# Patient Record
Sex: Female | Born: 1980 | Race: White | Hispanic: No | Marital: Single | Smoking: Current every day smoker
Health system: Southern US, Community
[De-identification: ages and names within clinical notes are randomized; demographics above are authoritative.]

## PROBLEM LIST (undated history)

## (undated) DIAGNOSIS — O009 Unspecified ectopic pregnancy without intrauterine pregnancy: Secondary | ICD-10-CM

## (undated) DIAGNOSIS — M549 Dorsalgia, unspecified: Secondary | ICD-10-CM

## (undated) DIAGNOSIS — G4700 Insomnia, unspecified: Secondary | ICD-10-CM

## (undated) DIAGNOSIS — R51 Headache: Secondary | ICD-10-CM

## (undated) DIAGNOSIS — K5900 Constipation, unspecified: Secondary | ICD-10-CM

## (undated) DIAGNOSIS — R410 Disorientation, unspecified: Secondary | ICD-10-CM

## (undated) DIAGNOSIS — K649 Unspecified hemorrhoids: Secondary | ICD-10-CM

## (undated) DIAGNOSIS — M1990 Unspecified osteoarthritis, unspecified site: Secondary | ICD-10-CM

## (undated) DIAGNOSIS — Z8709 Personal history of other diseases of the respiratory system: Secondary | ICD-10-CM

## (undated) DIAGNOSIS — M5430 Sciatica, unspecified side: Secondary | ICD-10-CM

## (undated) DIAGNOSIS — F329 Major depressive disorder, single episode, unspecified: Secondary | ICD-10-CM

## (undated) DIAGNOSIS — F32A Depression, unspecified: Secondary | ICD-10-CM

## (undated) DIAGNOSIS — R531 Weakness: Secondary | ICD-10-CM

## (undated) DIAGNOSIS — I10 Essential (primary) hypertension: Secondary | ICD-10-CM

## (undated) HISTORY — PX: ECTOPIC PREGNANCY SURGERY: SHX613

## (undated) HISTORY — PX: CHOLECYSTECTOMY: SHX55

---

## 2005-01-07 ENCOUNTER — Ambulatory Visit (HOSPITAL_COMMUNITY): Admission: RE | Admit: 2005-01-07 | Discharge: 2005-01-07 | Payer: Self-pay

## 2011-01-16 ENCOUNTER — Encounter: Payer: Self-pay | Admitting: Family Medicine

## 2013-12-06 ENCOUNTER — Other Ambulatory Visit: Payer: Self-pay | Admitting: Neurosurgery

## 2013-12-10 ENCOUNTER — Emergency Department (HOSPITAL_COMMUNITY): Payer: Medicaid Other

## 2013-12-10 ENCOUNTER — Emergency Department (HOSPITAL_COMMUNITY)
Admission: EM | Admit: 2013-12-10 | Discharge: 2013-12-11 | Disposition: A | Discharge status: Home / Self Care | Payer: Medicaid Other | Attending: Emergency Medicine | Admitting: Emergency Medicine

## 2013-12-10 ENCOUNTER — Encounter (HOSPITAL_COMMUNITY): Payer: Self-pay | Admitting: Emergency Medicine

## 2013-12-10 DIAGNOSIS — I10 Essential (primary) hypertension: Secondary | ICD-10-CM | POA: Insufficient documentation

## 2013-12-10 DIAGNOSIS — F17200 Nicotine dependence, unspecified, uncomplicated: Secondary | ICD-10-CM | POA: Insufficient documentation

## 2013-12-10 DIAGNOSIS — K5909 Other constipation: Secondary | ICD-10-CM | POA: Insufficient documentation

## 2013-12-10 DIAGNOSIS — M549 Dorsalgia, unspecified: Secondary | ICD-10-CM | POA: Insufficient documentation

## 2013-12-10 DIAGNOSIS — Z3202 Encounter for pregnancy test, result negative: Secondary | ICD-10-CM | POA: Insufficient documentation

## 2013-12-10 DIAGNOSIS — Z79899 Other long term (current) drug therapy: Secondary | ICD-10-CM | POA: Insufficient documentation

## 2013-12-10 HISTORY — DX: Unspecified ectopic pregnancy without intrauterine pregnancy: O00.90

## 2013-12-10 HISTORY — DX: Dorsalgia, unspecified: M54.9

## 2013-12-10 HISTORY — DX: Essential (primary) hypertension: I10

## 2013-12-10 LAB — BASIC METABOLIC PANEL
Chloride: 92 mEq/L — ABNORMAL LOW (ref 96–112)
Creatinine, Ser: 1.21 mg/dL — ABNORMAL HIGH (ref 0.50–1.10)
GFR calc Af Amer: 67 mL/min — ABNORMAL LOW (ref >90)
Potassium: 3.7 mEq/L (ref 3.5–5.1)
Sodium: 134 mEq/L — ABNORMAL LOW (ref 135–145)

## 2013-12-10 LAB — CBC WITH DIFFERENTIAL/PLATELET
Eosinophils Relative: 2 % (ref 0–5)
Hemoglobin: 14.3 g/dL (ref 12.0–15.0)
Lymphocytes Relative: 18 % (ref 12–46)
Monocytes Relative: 6 % (ref 3–12)
Neutrophils Relative %: 74 % (ref 43–77)
Platelets: 355 10*3/uL (ref 150–400)
RBC: 4.42 MIL/uL (ref 3.87–5.11)
WBC: 18.4 10*3/uL — ABNORMAL HIGH (ref 4.0–10.5)

## 2013-12-10 LAB — POCT PREGNANCY, URINE: Preg Test, Ur: NEGATIVE

## 2013-12-10 MED ORDER — ONDANSETRON HCL 4 MG/2ML IJ SOLN
4.0000 mg | Freq: Once | INTRAMUSCULAR | Status: AC
  Administered 2013-12-10: 4 mg via INTRAVENOUS
  Filled 2013-12-10: qty 2

## 2013-12-10 MED ORDER — MORPHINE SULFATE 4 MG/ML IJ SOLN
4.0000 mg | Freq: Once | INTRAMUSCULAR | Status: AC
  Administered 2013-12-10: 4 mg via INTRAVENOUS
  Filled 2013-12-10: qty 1

## 2013-12-10 MED ORDER — IOHEXOL 300 MG/ML  SOLN
100.0000 mL | Freq: Once | INTRAMUSCULAR | Status: AC | PRN
  Administered 2013-12-10: 100 mL via INTRAVENOUS

## 2013-12-10 MED ORDER — IOHEXOL 300 MG/ML  SOLN
50.0000 mL | Freq: Once | INTRAMUSCULAR | Status: AC | PRN
  Administered 2013-12-10: 50 mL via ORAL

## 2013-12-10 MED ORDER — SODIUM CHLORIDE 0.9 % IV SOLN
Freq: Once | INTRAVENOUS | Status: AC
  Administered 2013-12-10: 22:00:00 via INTRAVENOUS

## 2013-12-10 NOTE — ED Notes (Signed)
C/o constipation, tried ex-lax and MOM yesterday without relief, taking pain meds for back pain, no BM for at least a week

## 2013-12-11 MED ORDER — GLYCERIN (LAXATIVE) 1.2 G RE SUPP
RECTAL | Status: AC
  Filled 2013-12-11: qty 2

## 2013-12-11 MED ORDER — DOCUSATE SODIUM 250 MG PO CAPS: 250.0000 mg | ORAL_CAPSULE | Freq: Every day | ORAL | Status: DC

## 2013-12-11 MED ORDER — GLYCERIN (LAXATIVE) 2.1 G RE SUPP
1.0000 | Freq: Once | RECTAL | Status: AC
  Administered 2013-12-11: 2 via RECTAL
  Filled 2013-12-11: qty 1

## 2013-12-11 NOTE — ED Notes (Signed)
Patient given discharge instruction, verbalized understand. IV removed, band aid applied. Patient ambulatory out of the department.  

## 2013-12-11 NOTE — ED Provider Notes (Signed)
CSN: [id number]     Arrival date & time 12/10/13  1827 History   First MD Initiated Contact with Patient 12/10/13 1931     Chief Complaint  Patient presents with  . Constipation   (Consider location/radiation/quality/duration/timing/severity/associated sxs/prior Treatment) Patient is a 32 y.o. female presenting with constipation. The history is provided by the patient.  Constipation Severity:  Moderate Time since last bowel movement:  1 week Timing:  Constant Progression:  Unchanged Chronicity:  New Context: medication and narcotics   Context: not dehydration and not dietary changes   Stool description:  Hard Relieved by:  Nothing Worsened by:  Nothing tried Ineffective treatments:  Laxatives (tried Ex-lax yesterday and again this morning) Associated symptoms: abdominal pain, back pain and flatus   Associated symptoms: no dysuria, no fever, no nausea, no urinary retention and no vomiting   Risk factors: change in medication   Risk factors: no hx of abdominal surgery     Past Medical History  Diagnosis Date  . Back pain   . Hypertension   . Pregnancy, ectopic    Past Surgical History  Procedure Laterality Date  . Ectopic pregnancy surgery     History reviewed. No pertinent family history. History  Substance Use Topics  . Smoking status: Current Every Day Smoker    Types: Cigarettes  . Smokeless tobacco: Not on file  . Alcohol Use: No   OB History   Grav Para Term Preterm Abortions TAB SAB Ect Mult Living                 Review of Systems  Constitutional: Negative for fever, chills and appetite change.  Respiratory: Negative for chest tightness and shortness of breath.   Cardiovascular: Negative for chest pain.  Gastrointestinal: Positive for abdominal pain, constipation and flatus. Negative for nausea, vomiting, blood in stool and abdominal distention.  Genitourinary: Negative for dysuria, flank pain, decreased urine volume and difficulty urinating.   Musculoskeletal: Positive for back pain.  Skin: Negative for color change and rash.  Neurological: Negative for dizziness, weakness and numbness.  Hematological: Negative for adenopathy.  Psychiatric/Behavioral: Negative for confusion.  All other systems reviewed and are negative.    Allergies  Review of patient's allergies indicates no known allergies.  Home Medications   Current Outpatient Rx  Name  Route  Sig  Dispense  Refill  . benazepril (LOTENSIN) 20 MG tablet   Oral   Take 20 mg by mouth at bedtime.         . chlorthalidone (HYGROTON) 25 MG tablet   Oral   Take 25 mg by mouth at bedtime.         . cyclobenzaprine (FLEXERIL) 10 MG tablet   Oral   Take 10 mg by mouth 3 (three) times daily as needed for muscle spasms.         . diazepam (VALIUM) 5 MG tablet   Oral   Take 5 mg by mouth every 6 (six) hours as needed for muscle spasms.         . Oxycodone HCl 10 MG TABS   Oral   Take 10 mg by mouth every 4 (four) hours as needed (for pain).         . traMADol (ULTRAM) 50 MG tablet   Oral   Take by mouth every 6 (six) hours as needed.          BP 109/74  Pulse 100  Temp(Src) 97.9 F (36.6 C) (Oral)  Resp 20  Ht  5\' 3"  (1.6 m)  Wt 240 lb (108.863 kg)  BMI 42.52 kg/m2  SpO2 100%   Physical Exam  Nursing note and vitals reviewed. Constitutional: She is oriented to person, place, and time. She appears well-developed and well-nourished. No distress.  HENT:  Head: Normocephalic and atraumatic.  Mouth/Throat: Oropharynx is clear and moist.  Neck: Normal range of motion. Neck supple.  Cardiovascular: Normal rate, regular rhythm, normal heart sounds and intact distal pulses.   No murmur heard. Pulmonary/Chest: Effort normal and breath sounds normal. No respiratory distress.  Abdominal: Soft. Normal appearance and bowel sounds are normal. She exhibits no distension and no mass. There is no hepatosplenomegaly. There is tenderness in the right lower  quadrant. There is no rigidity, no rebound, no guarding, no CVA tenderness and no tenderness at McBurney's point.  Diffuse ttp of the lower abdomen.  No distention, guarding or rebound tenderness  Musculoskeletal: Normal range of motion. She exhibits no edema.  Lymphadenopathy:    She has no cervical adenopathy.  Neurological: She is alert and oriented to person, place, and time. She exhibits normal muscle tone. Coordination normal.  Skin: Skin is warm and dry.    ED Course  Procedures (including critical care time) Labs Review Labs Reviewed  CBC WITH DIFFERENTIAL - Abnormal; Notable for the following:    WBC 18.4 (*)    Neutro Abs 13.6 (*)    Monocytes Absolute 1.1 (*)    All other components within normal limits  BASIC METABOLIC PANEL - Abnormal; Notable for the following:    Sodium 134 (*)    Chloride 92 (*)    Glucose, Bld 148 (*)    Creatinine, Ser 1.21 (*)    GFR calc non Af Amer 58 (*)    GFR calc Af Amer 67 (*)    All other components within normal limits  POCT PREGNANCY, URINE   Imaging Review Dg Abd 1 View  12/10/2013   CLINICAL DATA:  Abdominal pain and constipation.  EXAM: ABDOMEN - 1 VIEW  COMPARISON:  None.  FINDINGS: Moderate stool in the rectum and sigmoid colon. No distended small bowel loops. No free air. The soft tissue shadows are maintained. No worrisome calcifications. The bony structures are intact.  IMPRESSION: No plain film findings for an acute abdominal process. Moderate stool noted in the rectum and sigmoid colon.   Electronically Signed   By: Loralie Champagne M.D.   On: 12/10/2013 20:39   Ct Abdomen Pelvis W Contrast  12/10/2013   CLINICAL DATA:  Constant  EXAM: CT ABDOMEN AND PELVIS WITH CONTRAST  TECHNIQUE: Multidetector CT imaging of the abdomen and pelvis was performed using the standard protocol following bolus administration of intravenous contrast.  CONTRAST:  50mL OMNIPAQUE IOHEXOL 300 MG/ML SOLN, OMNIPAQUE IOHEXOL 300 MG/ML SOLN   COMPARISON:  01/05/2005  FINDINGS: BODY WALL: Unremarkable.  LOWER CHEST: Unremarkable.  ABDOMEN/PELVIS:  Liver: Diffuse steatotic infiltration.  Biliary: No evidence of biliary obstruction or stone.  Pancreas: Unremarkable.  Spleen: Unremarkable.  Adrenals: Unremarkable.  Kidneys and ureters: No hydronephrosis or stone.  Bladder: Unremarkable.  Reproductive: Unremarkable.  Bowel: Formed stool distends the distal colon, especially the rectum, which measures up to 6.5 cm in diameter. No obstructing cause seen. Normal appendix.  Retroperitoneum: No mass or adenopathy.  Peritoneum: No free fluid or gas.  Vascular: No acute abnormality.  OSSEOUS: Lower lumbar degenerative disc disease with disc bulges and endplate spurs.  IMPRESSION: 1. Constipation with developing rectal impaction. 2. Fatty liver.  Electronically Signed   By: Tiburcio Pea M.D.   On: 12/10/2013 23:55    EKG Interpretation   None       MDM   VSS.  Patient is non-toxic appearing, tolerating po fluids and ambulated to the restroom.  Diffuse tenderness to the lower abdomen w/o peritoneal signs.  Reviewed results with patient.  Has been taking narcotic pain medication for chronic low back pain which is likely cause of the constipation.  She is scheduled for back surgery in January.    Glycerin supp given.  Pt has also received IVF's.  VSS.  Had large BM and symptoms have greatly improved.  She agrees to [employer] daily while taking her oxycodone, increased fluids.  Appears stable for discharge.      Karter Hellmer L. Trisha Mangle, PA-C 12/11/13 2144

## 2013-12-12 NOTE — ED Provider Notes (Signed)
Medical screening examination/treatment/procedure(s) were performed by non-physician practitioner and as supervising physician I was immediately available for consultation/collaboration.  EKG Interpretation   None         Cheyne Boulden L Piya Mesch, MD 12/12/13 2317 

## 2013-12-24 ENCOUNTER — Encounter (HOSPITAL_COMMUNITY): Payer: Self-pay

## 2013-12-25 ENCOUNTER — Ambulatory Visit (HOSPITAL_COMMUNITY)
Admission: RE | Admit: 2013-12-25 | Discharge: 2013-12-25 | Disposition: A | Discharge status: Home / Self Care | Payer: Medicaid Other | Source: Ambulatory Visit | Attending: Anesthesiology | Admitting: Anesthesiology

## 2013-12-25 ENCOUNTER — Encounter (HOSPITAL_COMMUNITY)
Admission: RE | Admit: 2013-12-25 | Discharge: 2013-12-25 | Disposition: A | Discharge status: Home / Self Care | Payer: Medicaid Other | Source: Ambulatory Visit | Attending: Neurosurgery | Admitting: Neurosurgery

## 2013-12-25 ENCOUNTER — Encounter (HOSPITAL_COMMUNITY): Payer: Self-pay

## 2013-12-25 DIAGNOSIS — Z01810 Encounter for preprocedural cardiovascular examination: Secondary | ICD-10-CM | POA: Insufficient documentation

## 2013-12-25 DIAGNOSIS — Z01818 Encounter for other preprocedural examination: Secondary | ICD-10-CM | POA: Insufficient documentation

## 2013-12-25 DIAGNOSIS — Z01812 Encounter for preprocedural laboratory examination: Secondary | ICD-10-CM | POA: Insufficient documentation

## 2013-12-25 HISTORY — DX: Sciatica, unspecified side: M54.30

## 2013-12-25 HISTORY — DX: Unspecified hemorrhoids: K64.9

## 2013-12-25 HISTORY — DX: Depression, unspecified: F32.A

## 2013-12-25 HISTORY — DX: Disorientation, unspecified: R41.0

## 2013-12-25 HISTORY — DX: Weakness: R53.1

## 2013-12-25 HISTORY — DX: Insomnia, unspecified: G47.00

## 2013-12-25 HISTORY — DX: Personal history of other diseases of the respiratory system: Z87.09

## 2013-12-25 HISTORY — DX: Headache: R51

## 2013-12-25 HISTORY — DX: Unspecified osteoarthritis, unspecified site: M19.90

## 2013-12-25 HISTORY — DX: Major depressive disorder, single episode, unspecified: F32.9

## 2013-12-25 HISTORY — DX: Constipation, unspecified: K59.00

## 2013-12-25 LAB — BASIC METABOLIC PANEL WITH GFR
BUN: 11 mg/dL (ref 6–23)
CO2: 30 meq/L (ref 19–32)
Calcium: 9.4 mg/dL (ref 8.4–10.5)
Chloride: 95 meq/L — ABNORMAL LOW (ref 96–112)
Creatinine, Ser: 1.04 mg/dL (ref 0.50–1.10)
GFR calc Af Amer: 80 mL/min — ABNORMAL LOW
GFR calc non Af Amer: 69 mL/min — ABNORMAL LOW
Glucose, Bld: 90 mg/dL (ref 70–99)
Potassium: 3.8 meq/L (ref 3.7–5.3)
Sodium: 138 meq/L (ref 137–147)

## 2013-12-25 LAB — HCG, SERUM, QUALITATIVE: Preg, Serum: NEGATIVE

## 2013-12-25 LAB — CBC
HCT: 39.7 % (ref 36.0–46.0)
Hemoglobin: 13.8 g/dL (ref 12.0–15.0)
MCH: 33.1 pg (ref 26.0–34.0)
MCHC: 34.8 g/dL (ref 30.0–36.0)
Platelets: 364 10*3/uL (ref 150–400)
RBC: 4.17 MIL/uL (ref 3.87–5.11)
WBC: 14 10*3/uL — ABNORMAL HIGH (ref 4.0–10.5)

## 2013-12-25 LAB — SURGICAL PCR SCREEN
MRSA, PCR: NEGATIVE
Staphylococcus aureus: NEGATIVE

## 2013-12-25 NOTE — Progress Notes (Signed)
Pt doesn't have a cardiologist  Denies ever having an echo/stress test/heart cath  Medical Md is Dr.Hasajni  Denies having an ekg or cxr in past year

## 2014-01-01 MED ORDER — CEFAZOLIN SODIUM-DEXTROSE 2-3 GM-% IV SOLR
2.0000 g | INTRAVENOUS | Status: AC
  Administered 2014-01-02: 2 g via INTRAVENOUS
  Filled 2014-01-01: qty 50

## 2014-01-02 ENCOUNTER — Inpatient Hospital Stay (HOSPITAL_COMMUNITY)
Admission: RE | Admit: 2014-01-02 | Discharge: 2014-01-04 | DRG: 520 | Disposition: A | Discharge status: Home / Self Care | Payer: Medicaid Other | Source: Ambulatory Visit | Attending: Neurosurgery | Admitting: Neurosurgery

## 2014-01-02 ENCOUNTER — Encounter (HOSPITAL_COMMUNITY): Payer: Medicaid Other | Admitting: Anesthesiology

## 2014-01-02 ENCOUNTER — Encounter (HOSPITAL_COMMUNITY)
Admission: RE | Disposition: A | Discharge status: Home / Self Care | Payer: Self-pay | Source: Ambulatory Visit | Attending: Neurosurgery

## 2014-01-02 ENCOUNTER — Encounter (HOSPITAL_COMMUNITY): Payer: Self-pay | Admitting: Surgery

## 2014-01-02 ENCOUNTER — Ambulatory Visit (HOSPITAL_COMMUNITY): Payer: Medicaid Other

## 2014-01-02 ENCOUNTER — Ambulatory Visit (HOSPITAL_COMMUNITY): Payer: Medicaid Other | Admitting: Anesthesiology

## 2014-01-02 DIAGNOSIS — M1990 Unspecified osteoarthritis, unspecified site: Secondary | ICD-10-CM | POA: Diagnosis present

## 2014-01-02 DIAGNOSIS — G4700 Insomnia, unspecified: Secondary | ICD-10-CM | POA: Diagnosis present

## 2014-01-02 DIAGNOSIS — IMO0002 Reserved for concepts with insufficient information to code with codable children: Secondary | ICD-10-CM | POA: Diagnosis present

## 2014-01-02 DIAGNOSIS — I10 Essential (primary) hypertension: Secondary | ICD-10-CM | POA: Diagnosis present

## 2014-01-02 DIAGNOSIS — M5126 Other intervertebral disc displacement, lumbar region: Principal | ICD-10-CM | POA: Diagnosis present

## 2014-01-02 DIAGNOSIS — F3289 Other specified depressive episodes: Secondary | ICD-10-CM | POA: Diagnosis present

## 2014-01-02 DIAGNOSIS — G709 Myoneural disorder, unspecified: Secondary | ICD-10-CM | POA: Diagnosis present

## 2014-01-02 DIAGNOSIS — F17200 Nicotine dependence, unspecified, uncomplicated: Secondary | ICD-10-CM | POA: Diagnosis present

## 2014-01-02 DIAGNOSIS — F329 Major depressive disorder, single episode, unspecified: Secondary | ICD-10-CM | POA: Diagnosis present

## 2014-01-02 HISTORY — PX: LUMBAR LAMINECTOMY/DECOMPRESSION MICRODISCECTOMY: SHX5026

## 2014-01-02 SURGERY — LUMBAR LAMINECTOMY/DECOMPRESSION MICRODISCECTOMY 2 LEVELS
Anesthesia: General | Site: Back | Laterality: Left

## 2014-01-02 MED ORDER — ALBUMIN HUMAN 5 % IV SOLN
INTRAVENOUS | Status: AC
  Filled 2014-01-02: qty 250

## 2014-01-02 MED ORDER — HEMOSTATIC AGENTS (NO CHARGE) OPTIME
TOPICAL | Status: DC | PRN
  Administered 2014-01-02: 1 via TOPICAL

## 2014-01-02 MED ORDER — ONDANSETRON HCL 4 MG/2ML IJ SOLN: 4.0000 mg | Freq: Once | INTRAMUSCULAR | Status: DC | PRN

## 2014-01-02 MED ORDER — PHENOL 1.4 % MT LIQD
1.0000 | OROMUCOSAL | Status: DC | PRN
  Administered 2014-01-02: 1 via OROMUCOSAL
  Filled 2014-01-02: qty 177

## 2014-01-02 MED ORDER — ACETAMINOPHEN 325 MG PO TABS
650.0000 mg | ORAL_TABLET | ORAL | Status: DC | PRN
Start: 2014-01-02 — End: 2014-01-04

## 2014-01-02 MED ORDER — LACTATED RINGERS IV SOLN
INTRAVENOUS | Status: DC
  Administered 2014-01-02: 11:00:00 via INTRAVENOUS

## 2014-01-02 MED ORDER — EPHEDRINE SULFATE 50 MG/ML IJ SOLN
INTRAMUSCULAR | Status: DC | PRN
  Administered 2014-01-02: 10 mg via INTRAVENOUS
  Administered 2014-01-02 (×4): 5 mg via INTRAVENOUS

## 2014-01-02 MED ORDER — CHLORTHALIDONE 25 MG PO TABS
25.0000 mg | ORAL_TABLET | Freq: Every day | ORAL | Status: DC
  Administered 2014-01-02: 25 mg via ORAL
  Filled 2014-01-02 (×3): qty 1

## 2014-01-02 MED ORDER — OXYCODONE-ACETAMINOPHEN 5-325 MG PO TABS
1.0000 | ORAL_TABLET | ORAL | Status: DC | PRN
  Administered 2014-01-02: 2 via ORAL
  Administered 2014-01-03: 1 via ORAL
  Administered 2014-01-03 – 2014-01-04 (×4): 2 via ORAL
  Filled 2014-01-02: qty 2
  Filled 2014-01-02: qty 1
  Filled 2014-01-02 (×4): qty 2

## 2014-01-02 MED ORDER — DIAZEPAM 5 MG PO TABS
5.0000 mg | ORAL_TABLET | Freq: Four times a day (QID) | ORAL | Status: DC | PRN
  Administered 2014-01-03 – 2014-01-04 (×2): 5 mg via ORAL
  Filled 2014-01-02 (×2): qty 1

## 2014-01-02 MED ORDER — SODIUM CHLORIDE 0.9 % IJ SOLN: 3.0000 mL | INTRAMUSCULAR | Status: DC | PRN

## 2014-01-02 MED ORDER — LIDOCAINE HCL (CARDIAC) 20 MG/ML IV SOLN
INTRAVENOUS | Status: DC | PRN
  Administered 2014-01-02: 80 mg via INTRAVENOUS

## 2014-01-02 MED ORDER — BUPIVACAINE LIPOSOME 1.3 % IJ SUSP
INTRAMUSCULAR | Status: DC | PRN
  Administered 2014-01-02: 20 mL

## 2014-01-02 MED ORDER — MENTHOL 3 MG MT LOZG
1.0000 | LOZENGE | OROMUCOSAL | Status: DC | PRN
  Filled 2014-01-02: qty 9

## 2014-01-02 MED ORDER — PROPOFOL 10 MG/ML IV BOLUS
INTRAVENOUS | Status: DC | PRN
  Administered 2014-01-02: 200 mg via INTRAVENOUS

## 2014-01-02 MED ORDER — FENTANYL CITRATE 0.05 MG/ML IJ SOLN
INTRAMUSCULAR | Status: DC | PRN
  Administered 2014-01-02 (×2): 50 ug via INTRAVENOUS
  Administered 2014-01-02: 150 ug via INTRAVENOUS

## 2014-01-02 MED ORDER — CEFAZOLIN SODIUM 1-5 GM-% IV SOLN
1.0000 g | Freq: Three times a day (TID) | INTRAVENOUS | Status: AC
  Administered 2014-01-02 – 2014-01-03 (×2): 1 g via INTRAVENOUS
  Filled 2014-01-02 (×2): qty 50

## 2014-01-02 MED ORDER — FENTANYL CITRATE 0.05 MG/ML IJ SOLN
INTRAMUSCULAR | Status: DC | PRN
  Administered 2014-01-02: 100 ug via INTRAVENOUS

## 2014-01-02 MED ORDER — 0.9 % SODIUM CHLORIDE (POUR BTL) OPTIME
TOPICAL | Status: DC | PRN
  Administered 2014-01-02: 1000 mL

## 2014-01-02 MED ORDER — ACETAMINOPHEN 650 MG RE SUPP: 650.0000 mg | RECTAL | Status: DC | PRN

## 2014-01-02 MED ORDER — ONDANSETRON HCL 4 MG/2ML IJ SOLN
4.0000 mg | INTRAMUSCULAR | Status: DC | PRN
  Administered 2014-01-02: 4 mg via INTRAVENOUS
  Filled 2014-01-02: qty 2

## 2014-01-02 MED ORDER — FENTANYL CITRATE 0.05 MG/ML IJ SOLN
INTRAMUSCULAR | Status: AC
  Filled 2014-01-02: qty 2

## 2014-01-02 MED ORDER — ALBUMIN HUMAN 5 % IV SOLN
12.5000 g | Freq: Once | INTRAVENOUS | Status: AC
  Administered 2014-01-02: 12.5 g via INTRAVENOUS

## 2014-01-02 MED ORDER — ONDANSETRON HCL 4 MG/2ML IJ SOLN
INTRAMUSCULAR | Status: DC | PRN
  Administered 2014-01-02: 4 mg via INTRAVENOUS

## 2014-01-02 MED ORDER — BUPIVACAINE LIPOSOME 1.3 % IJ SUSP
20.0000 mL | Freq: Once | INTRAMUSCULAR | Status: DC
  Filled 2014-01-02: qty 20

## 2014-01-02 MED ORDER — MORPHINE SULFATE 2 MG/ML IJ SOLN: 1.0000 mg | INTRAMUSCULAR | Status: DC | PRN

## 2014-01-02 MED ORDER — BENAZEPRIL HCL 20 MG PO TABS
20.0000 mg | ORAL_TABLET | Freq: Every day | ORAL | Status: DC
Start: 2014-01-02 — End: 2014-01-04
  Administered 2014-01-02: 20 mg via ORAL
  Filled 2014-01-02 (×3): qty 1

## 2014-01-02 MED ORDER — ARTIFICIAL TEARS OP OINT
TOPICAL_OINTMENT | OPHTHALMIC | Status: DC | PRN
  Administered 2014-01-02: 1 via OPHTHALMIC

## 2014-01-02 MED ORDER — SODIUM CHLORIDE 0.9 % IV SOLN
INTRAVENOUS | Status: DC
  Administered 2014-01-02: 22:00:00 via INTRAVENOUS

## 2014-01-02 MED ORDER — HYDROMORPHONE HCL PF 1 MG/ML IJ SOLN
INTRAMUSCULAR | Status: AC
  Administered 2014-01-02: 0.5 mg
  Filled 2014-01-02: qty 1

## 2014-01-02 MED ORDER — HYDROMORPHONE HCL PF 1 MG/ML IJ SOLN
0.2500 mg | INTRAMUSCULAR | Status: DC | PRN
  Administered 2014-01-02 (×2): 0.5 mg via INTRAVENOUS

## 2014-01-02 MED ORDER — LACTATED RINGERS IV SOLN
INTRAVENOUS | Status: DC | PRN
  Administered 2014-01-02 (×2): via INTRAVENOUS

## 2014-01-02 MED ORDER — THROMBIN 5000 UNITS EX SOLR
CUTANEOUS | Status: DC | PRN
  Administered 2014-01-02 (×2): 5000 [IU] via TOPICAL

## 2014-01-02 MED ORDER — ZOLPIDEM TARTRATE 5 MG PO TABS: 10.0000 mg | ORAL_TABLET | Freq: Every evening | ORAL | Status: DC | PRN

## 2014-01-02 MED ORDER — LIDOCAINE HCL 4 % MT SOLN
OROMUCOSAL | Status: DC | PRN
  Administered 2014-01-02: 4 mL via TOPICAL

## 2014-01-02 MED ORDER — METHYLPREDNISOLONE ACETATE 80 MG/ML IJ SUSP
INTRAMUSCULAR | Status: DC | PRN
  Administered 2014-01-02: 80 mg

## 2014-01-02 MED ORDER — NEOSTIGMINE METHYLSULFATE 1 MG/ML IJ SOLN
INTRAMUSCULAR | Status: DC | PRN
  Administered 2014-01-02: 2 mg via INTRAVENOUS

## 2014-01-02 MED ORDER — SODIUM CHLORIDE 0.9 % IJ SOLN
3.0000 mL | Freq: Two times a day (BID) | INTRAMUSCULAR | Status: DC
  Administered 2014-01-02 – 2014-01-03 (×3): 3 mL via INTRAVENOUS

## 2014-01-02 MED ORDER — GLYCOPYRROLATE 0.2 MG/ML IJ SOLN
INTRAMUSCULAR | Status: DC | PRN
  Administered 2014-01-02: 0.4 mg via INTRAVENOUS

## 2014-01-02 MED ORDER — ROCURONIUM BROMIDE 100 MG/10ML IV SOLN
INTRAVENOUS | Status: DC | PRN
  Administered 2014-01-02: 50 mg via INTRAVENOUS

## 2014-01-02 SURGICAL SUPPLY — 58 items
APL SKNCLS STERI-STRIP NONHPOA (GAUZE/BANDAGES/DRESSINGS) ×1
BENZOIN TINCTURE PRP APPL 2/3 (GAUZE/BANDAGES/DRESSINGS) ×3 IMPLANT
BLADE SURG ROTATE 9660 (MISCELLANEOUS) IMPLANT
BUR ACORN 6.0 (BURR) IMPLANT
BUR ACORN 6.0MM (BURR)
BUR MATCHSTICK NEURO 3.0 LAGG (BURR) ×3 IMPLANT
CANISTER SUCT 3000ML (MISCELLANEOUS) ×3 IMPLANT
CLOSURE WOUND 1/2 X4 (GAUZE/BANDAGES/DRESSINGS) ×1
CONT SPEC 4OZ CLIKSEAL STRL BL (MISCELLANEOUS) ×3 IMPLANT
DRAPE LAPAROTOMY 100X72X124 (DRAPES) ×3 IMPLANT
DRAPE MICROSCOPE LEICA (MISCELLANEOUS) ×3 IMPLANT
DRAPE POUCH INSTRU U-SHP 10X18 (DRAPES) ×3 IMPLANT
DURAPREP 26ML APPLICATOR (WOUND CARE) ×3 IMPLANT
ELECT REM PT RETURN 9FT ADLT (ELECTROSURGICAL) ×3
ELECTRODE REM PT RTRN 9FT ADLT (ELECTROSURGICAL) ×1 IMPLANT
EVACUATOR 1/8 PVC DRAIN (DRAIN) ×2 IMPLANT
GAUZE SPONGE 4X4 16PLY XRAY LF (GAUZE/BANDAGES/DRESSINGS) IMPLANT
GLOVE BIO SURGEON STRL SZ8 (GLOVE) ×2 IMPLANT
GLOVE BIOGEL M 8.0 STRL (GLOVE) ×3 IMPLANT
GLOVE EXAM NITRILE LRG STRL (GLOVE) IMPLANT
GLOVE EXAM NITRILE MD LF STRL (GLOVE) IMPLANT
GLOVE EXAM NITRILE XL STR (GLOVE) IMPLANT
GLOVE EXAM NITRILE XS STR PU (GLOVE) IMPLANT
GLOVE INDICATOR 8.5 STRL (GLOVE) ×2 IMPLANT
GOWN BRE IMP SLV AUR LG STRL (GOWN DISPOSABLE) ×3 IMPLANT
GOWN BRE IMP SLV AUR XL STRL (GOWN DISPOSABLE) IMPLANT
GOWN STRL REIN 2XL LVL4 (GOWN DISPOSABLE) IMPLANT
KIT BASIN OR (CUSTOM PROCEDURE TRAY) ×3 IMPLANT
KIT ROOM TURNOVER OR (KITS) ×3 IMPLANT
NDL HYPO 18GX1.5 BLUNT FILL (NEEDLE) IMPLANT
NDL HYPO 21X1.5 SAFETY (NEEDLE) IMPLANT
NDL HYPO 25X1 1.5 SAFETY (NEEDLE) IMPLANT
NDL SPNL 20GX3.5 QUINCKE YW (NEEDLE) IMPLANT
NEEDLE HYPO 18GX1.5 BLUNT FILL (NEEDLE) ×3 IMPLANT
NEEDLE HYPO 21X1.5 SAFETY (NEEDLE) ×3 IMPLANT
NEEDLE HYPO 25X1 1.5 SAFETY (NEEDLE) IMPLANT
NEEDLE SPNL 20GX3.5 QUINCKE YW (NEEDLE) IMPLANT
NS IRRIG 1000ML POUR BTL (IV SOLUTION) ×3 IMPLANT
PACK LAMINECTOMY NEURO (CUSTOM PROCEDURE TRAY) ×3 IMPLANT
PAD ABD 8X10 STRL (GAUZE/BANDAGES/DRESSINGS) IMPLANT
PAD ARMBOARD 7.5X6 YLW CONV (MISCELLANEOUS) ×9 IMPLANT
PATTIES SURGICAL .5 X1 (DISPOSABLE) ×3 IMPLANT
RUBBERBAND STERILE (MISCELLANEOUS) ×6 IMPLANT
SPONGE GAUZE 4X4 12PLY (GAUZE/BANDAGES/DRESSINGS) ×3 IMPLANT
SPONGE LAP 4X18 X RAY DECT (DISPOSABLE) IMPLANT
SPONGE SURGIFOAM ABS GEL SZ50 (HEMOSTASIS) ×3 IMPLANT
STRIP CLOSURE SKIN 1/2X4 (GAUZE/BANDAGES/DRESSINGS) ×2 IMPLANT
SUT VIC AB 0 CT1 18XCR BRD8 (SUTURE) ×1 IMPLANT
SUT VIC AB 0 CT1 8-18 (SUTURE) ×3
SUT VIC AB 2-0 CP2 18 (SUTURE) ×3 IMPLANT
SUT VIC AB 3-0 SH 8-18 (SUTURE) ×3 IMPLANT
SYR 20CC LL (SYRINGE) ×2 IMPLANT
SYR 20ML ECCENTRIC (SYRINGE) ×3 IMPLANT
SYR 5ML LL (SYRINGE) ×2 IMPLANT
TAPE CLOTH SURG 4X10 WHT LF (GAUZE/BANDAGES/DRESSINGS) ×2 IMPLANT
TOWEL OR 17X24 6PK STRL BLUE (TOWEL DISPOSABLE) ×3 IMPLANT
TOWEL OR 17X26 10 PK STRL BLUE (TOWEL DISPOSABLE) ×3 IMPLANT
WATER STERILE IRR 1000ML POUR (IV SOLUTION) ×3 IMPLANT

## 2014-01-02 NOTE — Anesthesia Postprocedure Evaluation (Signed)
Anesthesia Post Note  Patient: Karen Cooley  Procedure(s) Performed: Procedure(s) (LRB): LUMBAR LAMINECTOMY/DECOMPRESSION MICRODISCECTOMY 2 LEVELS (Left)  Anesthesia type: General  Patient location: PACU  Post pain: Pain level controlled and Adequate analgesia  Post assessment: Post-op Vital signs reviewed, Patient's Cardiovascular Status Stable, Respiratory Function Stable, Patent Airway and Pain level controlled  Last Vitals:  Filed Vitals:   01/02/14 1803  BP:   Pulse: 72  Temp:   Resp: 19    Post vital signs: Reviewed and stable  Level of consciousness: awake, alert  and oriented  Complications: No apparent anesthesia complications

## 2014-01-02 NOTE — Anesthesia Preprocedure Evaluation (Addendum)
Anesthesia Evaluation  Patient identified by MRN, date of birth, ID band Patient awake    Reviewed: Allergy & Precautions, H&P , NPO status , Patient's Chart, lab work & pertinent test results  Airway Mallampati: II TM Distance: >3 FB Neck ROM: Full    Dental  (+) Teeth Intact,    Pulmonary Current Smoker,    Pulmonary exam normal       Cardiovascular hypertension,     Neuro/Psych  Headaches, PSYCHIATRIC DISORDERS Depression  Neuromuscular disease    GI/Hepatic   Endo/Other    Renal/GU      Musculoskeletal   Abdominal Normal abdominal exam  (+)   Peds  Hematology   Anesthesia Other Findings   Reproductive/Obstetrics                         Anesthesia Physical Anesthesia Plan  ASA: II  Anesthesia Plan: General   Post-op Pain Management:    Induction: Intravenous  Airway Management Planned: Oral ETT  Additional Equipment:   Intra-op Plan:   Post-operative Plan: Extubation in OR  Informed Consent: I have reviewed the patients History and Physical, chart, labs and discussed the procedure including the risks, benefits and alternatives for the proposed anesthesia with the patient or authorized representative who has indicated his/her understanding and acceptance.   Dental advisory given  Plan Discussed with: CRNA, Anesthesiologist and Surgeon  Anesthesia Plan Comments:         Anesthesia Quick Evaluation

## 2014-01-02 NOTE — Plan of Care (Signed)
Problem: Consults Goal: Diagnosis - Spinal Surgery Outcome: Completed/Met Date Met:  01/02/14 Microdiscectomy

## 2014-01-02 NOTE — H&P (Signed)
Karen Cooley is an 33 y.o. female.   Chief Complaint: left leg pain HPI: patient complaining of lumbar pain with radiation to the left leg which gets worse with cought.when she came to see me she was limping from the left leg. She is no better with conservative treatment and wants to go ahead with surgery  Past Medical History  Diagnosis Date  . Back pain     herniated nucleus pulposus  . Pregnancy, ectopic   . Hypertension     takes Benazepril and Chlorthalidone nightly  . History of bronchitis     last time unknown   . Headache(784.0)   . Weakness     numbness and tingling to the left leg  . Sciatica   . Osteoarthritis   . Constipation     Colace taken 10days as instructed   . Hemorrhoids   . Confusion     related to meds she is on   . Insomnia     recently and related to stress  . Depression     related to pain but no meds required    Past Surgical History  Procedure Laterality Date  . Ectopic pregnancy surgery  between 2006 and 2008    No family history on file. Social History:  reports that she has been smoking Cigarettes.  She has a 15 pack-year smoking history. She does not have any smokeless tobacco history on file. She reports that she drinks alcohol. She reports that she does not use illicit drugs.  Allergies: No Known Allergies  No prescriptions prior to admission    No results found for this or any previous visit (from the past 48 hour(s)). No results found.  Review of Systems  Constitutional: Negative.   HENT: Negative.   Eyes: Negative.   Respiratory: Positive for shortness of breath.   Cardiovascular:       Arterial hypertension  Gastrointestinal: Negative.   Genitourinary: Negative.   Musculoskeletal: Positive for back pain.  Skin: Negative.   Neurological: Positive for focal weakness.  Endo/Heme/Allergies: Negative.   Psychiatric/Behavioral: Positive for memory loss.    There were no vitals taken for this visit. Physical Exam hent,nl.  Neck, nl. Cv, nl. Lungs, some rales . Abdomen, soft. Extremities, nl. NEURO 2/5 WEAKNESS OF pf , 3/5 OF df. SENSORY CHANGES AT LEFT s1 DERMATOME. ABSENT LEFT ANKLE DTR. MRI SHOWS 2 LARGE HNP , ONE AT L4-5 CENTRAL AND THE OGTHER AT L5s1 TOWARD THE LEFT. SOME DDD AT L3-4  Assessment/Plan Patient to go ahead with LEFT 4-5 and 5-1 laminotomies, foraminotomieea and discectomies. She and her mother are aware of risks and benefits  Sandi Towe M 01/02/2014, 9:53 AM

## 2014-01-02 NOTE — Transfer of Care (Signed)
Immediate Anesthesia Transfer of Care Note  Patient: Karen Cooley  Procedure(s) Performed: Procedure(s) with comments: LUMBAR LAMINECTOMY/DECOMPRESSION MICRODISCECTOMY 2 LEVELS (Left) - Left L4-5 L5-S1 Microdiskectomy  Patient Location: PACU  Anesthesia Type:General  Level of Consciousness: awake, alert  and oriented  Airway & Oxygen Therapy: Patient Spontanous Breathing and Patient connected to nasal cannula oxygen  Post-op Assessment: Report given to PACU RN and Patient moving all extremities X 4  Post vital signs: Reviewed and stable  Complications: No apparent anesthesia complications

## 2014-01-02 NOTE — Preoperative (Signed)
Beta Blockers   Reason not to administer Beta Blockers:Not Applicable 

## 2014-01-02 NOTE — Progress Notes (Signed)
Pts. bp low,Dr. Justin MendFredrick aware and albumin ordered

## 2014-01-03 MED ORDER — PNEUMOCOCCAL VAC POLYVALENT 25 MCG/0.5ML IJ INJ
0.5000 mL | INJECTION | INTRAMUSCULAR | Status: DC
  Filled 2014-01-03: qty 0.5

## 2014-01-03 NOTE — Evaluation (Addendum)
Physical Therapy Evaluation Patient Details Name: Karen LinkFelicia A Nicklas MRN: [id number] DOB: 10/16/1981 Today's Date: 01/03/2014 Time: [id number] PT Time Calculation (min): 29 min  PT Assessment / Plan / Recommendation History of Present Illness  33 y.o. [facility] for elective Left 4-5, 5-1 foraminotomy, laminotomy, and diskectomy;  Clinical Impression  Pt is POD #1 s/p lumbar surgery.  She is moving well with appropriate post op pain.  She continues to have some paresthesias into her left buttocks and posterior thigh (which is better than PTA-it was going all the way down her legs into her toes).  She would benefit from a RW for short term home use as the one she has is a Std walker.   PT to follow acutely for deficits listed below.       PT Assessment  Patient needs continued PT services    Follow Up Recommendations  No PT follow up;Supervision for mobility/OOB    Does the patient have the potential to tolerate intense rehabilitation     NA  Barriers to Discharge   None      Equipment Recommendations  Rolling walker with 5" wheels    Recommendations for Other Services   None  Frequency Min 5X/week    Precautions / Restrictions Precautions Precautions: Back Precaution Booklet Issued: Yes (comment) Precaution Comments: Reviewed back precautions, functional examples, lifting restrictions, activity recommendations.   Required Braces or Orthoses:  (none) Restrictions Weight Bearing Restrictions: No   Pertinent Vitals/Pain See vitals flow sheet.       Mobility  Bed Mobility Overal bed mobility: Modified Independent General bed mobility comments: pt already in side lying, mod I with rail for support verbal cues not to twist  Transfers Overall transfer level: Needs assistance Equipment used: Rolling walker (2 wheeled) Transfers: Sit to/from Stand Sit to Stand: Supervision General transfer comment: verbal cues for safe hand placement.   Ambulation/Gait Ambulation/Gait assistance: Min guard Ambulation Distance (Feet): 70 Feet Assistive device: Rolling walker (2 wheeled) Gait Pattern/deviations: Step-through pattern Gait velocity: decreased General Gait Details: slow and cautious gait Stairs: Yes Stairs assistance: Min assist Stair Management: One rail Left;Step to pattern;Forwards (hand held assist right to simulate second rail. ) Number of Stairs: 3 General stair comments: Verbal cues for safest lower extremity sequencing, however, pt prefered to go down with her right foot first because this is how she did it PTA.          PT Diagnosis: Difficulty walking;Abnormality of gait;Generalized weakness;Acute pain  PT Problem List: Decreased strength;Decreased activity tolerance;Decreased balance;Decreased mobility;Decreased knowledge of use of DME;Decreased knowledge of precautions;Obesity;Pain PT Treatment Interventions: DME instruction;Gait training;Stair training;Functional mobility training;Therapeutic activities;Therapeutic exercise;Balance training;Neuromuscular re-education;Patient/family education;Modalities     PT Goals(Current goals can be found in the care plan section) Acute Rehab PT Goals Patient Stated Goal: to go home tomorrow PT Goal Formulation: With patient Time For Goal Achievement: 01/10/14 Potential to Achieve Goals: Good  Visit Information  Last PT Received On: 01/03/14 Assistance Needed: +1 History of Present Illness: 33 y.o. [facility] for elective Left 4-5, 5-1 foraminotomy, laminotomy, and diskectomy;       Prior Functioning  Home Living Family/patient expects to be discharged to:: Private residence Living Arrangements: Children (going home with mom for a few days, lives with 35 y.o. daugh) Available Help at Discharge: Family;Available 24 hours/day;Available PRN/intermittently (first 3 days 24/7, then after intermittent) Type of Home: House Home Access: Stairs to  enter [employer] CorporationEntrance Stairs-Number of Steps: 3  Entrance Stairs-Rails: Right;Left;Can reach both Home Layout: One level Home Equipment: Walker - standard Prior Function Level of Independence: Independent Communication Communication: No difficulties Dominant Hand: Right    Cognition  Cognition Arousal/Alertness: Awake/alert Behavior During Therapy: WFL for tasks assessed/performed Overall Cognitive Status: Within Functional Limits for tasks assessed    Extremity/Trunk Assessment Upper Extremity Assessment Upper Extremity Assessment: Defer to OT evaluation Lower Extremity Assessment Lower Extremity Assessment: Generalized weakness;LLE deficits/detail LLE Deficits / Details: left is functionally weakner than right, but no knee buckling noted with gait.  LLE Sensation: decreased light touch (in buttocks and posterior thigh) Cervical / Trunk Assessment Cervical / Trunk Assessment: Normal   Balance General Comments General comments (skin integrity, edema, etc.): Pt has a hemovac.    End of Session PT - End of Session Equipment Utilized During Treatment: Gait belt Activity Tolerance: Patient limited by pain Patient left: in chair;with call bell/phone within reach;with family/visitor present Nurse Communication: Mobility status;Other (comment) (needs RW for d/c, no HHPT)  GP Functional Assessment Tool Used: assist level Functional Limitation: Mobility: Walking and moving around Mobility: Walking and Moving Around Current Status ([id number]): At least 20 percent but less than 40 percent impaired, limited or restricted Mobility: Walking and Moving Around Goal Status [phone]): At least 1 percent but less than 20 percent impaired, limited or restricted   Brogen Duell B. Kimm Ungaro, PT, DPT [phone]   01/03/2014, 10:32 AM

## 2014-01-03 NOTE — Progress Notes (Signed)
Utilization review completed.  

## 2014-01-03 NOTE — Progress Notes (Signed)
Occupational Therapy Evaluation Patient Details Name: Karen Cooley MRN: [id number] DOB: 07-05-1981 Today's Date: 01/03/2014 Time: 1010-1029 OT Time Calculation (min): 19 min  OT Assessment / Plan / Recommendation History of present illness 33 y.o. female admitted to [facility] on 01/02/14 for elective Left 4-5, 5-1 foraminotomy, laminotomy, and diskectomy;   Clinical Impression   PTA, pt required occasional assist for ADL when painful, and was mod I with mobility. Pt presents with functional decline with ADL and will benefit from OT to increase independence with ADL and mobility for ADL with use of DME and AE, adhering to back precautions. Will see again today for AE education. Pt ready for D/C home when medically stable.     OT Assessment  Patient needs continued OT Services    Follow Up Recommendations  No OT follow up;Supervision/Assistance - 24 hour    Barriers to Discharge      Equipment Recommendations  Tub/shower seat    Recommendations for Other Services    Frequency  Min 2X/week    Precautions / Restrictions Precautions Precautions: Back Precaution Booklet Issued: Yes (comment) Precaution Comments: Reviewed back precautions, functional examples, lifting restrictions, activity recommendations.   Required Braces or Orthoses:  (none) Restrictions Weight Bearing Restrictions: No   Pertinent Vitals/Pain no apparent distress     ADL  Grooming: Supervision/safety;Set up Where Assessed - Grooming: Unsupported sitting Upper Body Bathing: Set up;Supervision/safety Where Assessed - Upper Body Bathing: Unsupported sitting Lower Body Bathing: Moderate assistance Where Assessed - Lower Body Bathing: Supported sit to stand Upper Body Dressing: Supervision/safety;Set up Where Assessed - Upper Body Dressing: Unsupported sitting Lower Body Dressing: Maximal assistance Where Assessed - Lower Body Dressing: Supported sit to stand Toilet Transfer: Hydrographic surveyor Method:  Other (comment) (ambulating) Acupuncturist: Comfort height toilet Toileting - Clothing Manipulation and Hygiene: Moderate assistance Where Assessed - Toileting Clothing Manipulation and Hygiene: Sit to stand from 3-in-1 or toilet Equipment Used: Long-handled shoe horn;Long-handled sponge;Reacher;Sock aid;Rolling walker Transfers/Ambulation Related to ADLs: S ADL Comments: Began educating pt on AE for LB ADL and toileting    OT Diagnosis: Generalized weakness;Acute pain  OT Problem List: Decreased activity tolerance;Decreased knowledge of use of DME or AE;Decreased knowledge of precautions;Pain OT Treatment Interventions: Self-care/ADL training;DME and/or AE instruction;Therapeutic activities;Patient/family education   OT Goals(Current goals can be found in the care plan section) Acute Rehab OT Goals Patient Stated Goal: to go home tomorrow OT Goal Formulation: With patient Time For Goal Achievement: 01/17/14 Potential to Achieve Goals: Good  Visit Information  Last OT Received On: 01/03/14 Assistance Needed: +1 History of Present Illness: 33 y.o. female admitted to [facility] on 01/02/14 for elective Left 4-5, 5-1 foraminotomy, laminotomy, and diskectomy;       Prior Functioning     Home Living Family/patient expects to be discharged to:: Private residence Living Arrangements: Children (going home with mom for a few days, lives with 9 y.o. daugh) Available Help at Discharge: Family;Available 24 hours/day;Available PRN/intermittently (first 3 days 24/7, then after intermittent) Type of Home: House Home Access: Stairs to enter [employer] of Steps: 3 Entrance Stairs-Rails: Right;Left;Can reach both Home Layout: One level Home Equipment: Walker - standard Prior Function Level of Independence: Needs assistance (daughter assisted occasionally when she needed help with ADL) ADL's / Homemaking Assistance Needed: occ min A Communication Communication: No  difficulties Dominant Hand: Right         Vision/Perception     Cognition  Cognition Arousal/Alertness: Awake/alert Behavior During Therapy: WFL for tasks  assessed/performed Overall Cognitive Status: Within Functional Limits for tasks assessed    Extremity/Trunk Assessment Upper Extremity Assessment Upper Extremity Assessment: Overall WFL for tasks assessed Lower Extremity Assessment Lower Extremity Assessment: Generalized weakness;LLE deficits/detail LLE Deficits / Details: left is functionally weakner than right, but no knee buckling noted with gait.  LLE Sensation: decreased light touch (in buttocks and posterior thigh) Cervical / Trunk Assessment Cervical / Trunk Assessment: Normal     Mobility Bed Mobility Overal bed mobility: Modified Independent General bed mobility comments: pt already in side lying, mod I with rail for support verbal cues not to twist  Transfers Overall transfer level: Needs assistance Equipment used: Rolling walker (2 wheeled) Transfers: Sit to/from Stand Sit to Stand: Supervision General transfer comment: verbal cues for safe hand placement.      Exercise     Balance General Comments General comments (skin integrity, edema, etc.): Pt has a hemovac.     End of Session OT - End of Session Equipment Utilized During Treatment: Gait belt;Rolling walker Activity Tolerance: Patient tolerated treatment well Patient left: in chair;with call bell/phone within reach Nurse Communication: Mobility status  GO     WARD,HILLARY 01/03/2014, 10:59 AM Luisa DagoHilary Ward, OTR/L  [phone] 01/03/2014

## 2014-01-03 NOTE — Progress Notes (Signed)
Patient ID: Karen Cooley, female   DOB: 01/10/1981, 33 y.o.   MRN: [id number] C/o pain to left leg but less than before surgery. No weakness. Ambulating with help. Dc in am

## 2014-01-03 NOTE — Progress Notes (Signed)
Occupational Therapy Treatment Patient Details Name: Karen Cooley MRN: [id number] DOB: Mar 07, 1981 Today's Date: 01/03/2014 Time: 1030-1314 OT Time Calculation (min): 23 min  OT Assessment / Plan / Recommendation  History of present illness 33 y.o. female admitted to [facility] on 01/02/14 for elective Left 4-5, 5-1 foraminotomy, laminotomy, and diskectomy;   OT comments  Completed education regarding use of AE for LB ADL and toileting. Pt @ mod I level with AE. Will need shower chair for home use. All goals met. D/C from OT.  Follow Up Recommendations  No OT follow up    Barriers to Discharge       Equipment Recommendations  Tub/shower seat    Recommendations for Other Services    Frequency Min 2X/week   Progress towards OT Goals Progress towards OT goals: Goals met/education completed, patient discharged from [facility] All goals met and education completed, patient discharged from OT services    Precautions / Restrictions Precautions Precautions: Back Precaution Booklet Issued: Yes (comment) Precaution Comments: Reviewed back precautions, functional examples, lifting restrictions, activity recommendations.   (able to recall 3/3 back precautions) Required Braces or Orthoses:  (none) Restrictions Weight Bearing Restrictions: No   Pertinent Vitals/Pain no apparent distress     ADL  Grooming: Supervision/safety;Set up Where Assessed - Grooming: Unsupported sitting Upper Body Bathing: Set up;Supervision/safety Where Assessed - Upper Body Bathing: Unsupported sitting Lower Body Bathing: Moderate assistance Where Assessed - Lower Body Bathing: Supported sit to stand Upper Body Dressing: Supervision/safety;Set up Where Assessed - Upper Body Dressing: Unsupported sitting Lower Body Dressing: Maximal assistance Where Assessed - Lower Body Dressing: Supported sit to stand Toilet Transfer: Magazine features editor Method: Other (comment) (ambulating) Science writer:  Comfort height toilet Toileting - Clothing Manipulation and Hygiene: Moderate assistance Where Assessed - Toileting Clothing Manipulation and Hygiene: Sit to stand from 3-in-1 or toilet Equipment Used: Long-handled shoe horn;Long-handled sponge;Reacher;Sock aid;Rolling walker Transfers/Ambulation Related to ADLs: S ADL Comments: Pt given AE kit, in addition to toilet aid (CAID pt). to increase independence with ADL     OT Diagnosis: Generalized weakness;Acute pain  OT Problem List: Decreased activity tolerance;Decreased knowledge of use of DME or AE;Decreased knowledge of precautions;Pain OT Treatment Interventions: Self-care/ADL training;DME and/or AE instruction;Therapeutic activities;Patient/family education   OT Goals(current goals can now be found in the care plan section) Acute Rehab OT Goals Patient Stated Goal: to go home tomorrow OT Goal Formulation: With patient Time For Goal Achievement: 01/17/14 Potential to Achieve Goals: Good ADL Goals Pt Will Perform Lower Body Bathing: with supervision;with set-up;with adaptive equipment;sit to/from stand Pt Will Perform Lower Body Dressing: with set-up;with supervision;with adaptive equipment;sit to/from stand Pt Will Transfer to Toilet: with supervision;regular height toilet;ambulating Pt Will Perform Toileting - Clothing Manipulation and hygiene: with set-up;with supervision;with adaptive equipment;sit to/from stand  Visit Information  Last OT Received On: 01/03/14 Assistance Needed: +1 History of Present Illness: 33 y.o. female admitted to [facility] on 01/02/14 for elective Left 4-5, 5-1 foraminotomy, laminotomy, and diskectomy;    Subjective Data      Prior Functioning  Home Living Family/patient expects to be discharged to:: Private residence Living Arrangements: Children (going home with mom for a few days, lives with 13 y.o. daugh) Available Help at Discharge: Family;Available 24 hours/day;Available PRN/intermittently (first 3 days  24/7, then after intermittent) Type of Home: House Home Access: Stairs to enter [employer] of Steps: 3 Entrance Stairs-Rails: Right;Left;Can reach both Home Layout: One level Home Equipment: Walker - standard Prior Function Level of  Independence: Needs assistance (daughter assisted occasionally when she needed help with ADL) ADL's / Homemaking Assistance Needed: occ min A Communication Communication: No difficulties Dominant Hand: Right    Cognition  Cognition Arousal/Alertness: Awake/alert Behavior During Therapy: WFL for tasks assessed/performed Overall Cognitive Status: Within Functional Limits for tasks assessed    Mobility  Bed Mobility Overal bed mobility: Modified Independent General bed mobility comments: pt already in side lying, mod I with rail for support verbal cues not to twist  Transfers Overall transfer level: Needs assistance Equipment used: Rolling walker (2 wheeled) Transfers: Sit to/from Stand Sit to Stand: Supervision General transfer comment: verbal cues for safe hand placement.     Exercises      Balance General Comments General comments (skin integrity, edema, etc.): Pt has a hemovac.    End of Session OT - End of Session Equipment Utilized During Treatment: Gait belt;Rolling walker Activity Tolerance: Patient tolerated treatment well Patient left: in chair Nurse Communication: Mobility status  GO     Karen Cooley,Karen Cooley 01/03/2014, 11:37 AM Maurie Boettcher, OTR/L  [phone] 01/03/2014

## 2014-01-03 NOTE — Op Note (Signed)
NAMScherrie November:  Marohl, Evelyna              ACCOUNT NO.:  [id number]  MEDICAL RECORD NO.:  [id number]  LOCATION:  3C06C                        FACILITY:  [facility]  PHYSICIAN:  Hilda LiasErnesto Amybeth Sieg, M.D.   DATE OF BIRTH:  [birth year]  DATE OF PROCEDURE: DATE OF DISCHARGE:                              OPERATIVE REPORT   PREOPERATIVE DIAGNOSES:  Left L4-5 central to the left herniated disk, left L5-1 herniated disk with fragment to the left.  Morbid obesity.  POSTOPERATIVE DIAGNOSIS:  Left L4-5 central to the left herniated disk, left L5-1 herniated disk with fragment to the left.  Morbid obesity.  PROCEDURE:  Left 4-5, 5-1 foraminotomy, laminotomy, and diskectomy; decompression of the thecal sac as well as the L4, L5, and S1 nerve root.  Microscope.  SURGEON:  Hilda LiasErnesto Librada Castronovo, M.D.  ASSISTANT:  Dr. Wynetta Emeryram.  CLINICAL HISTORY:  Ms. Cresenciano Genreruitt is a 33 year old female, 105 pounds weight, hypertensive, who developed back pain radiating to the left leg. She had failed with conservative treatment.  MRI showed that she has a large herniated disk at L4-5 central to the left, and a large one at L5- 1 mostly going to the left side.  The patient has failed conservative treatment.  Surgery was advised.  The patient knew the risk of the surgery including the possibility that later on she might require further surgery which might end up in lumbar fusion.  She is fully aware that she had to become more active and try to lose weight.  DESCRIPTION OF PROCEDURE:  The patient was taken to the OR, and after intubation she was positioned in a prone manner.  The skin was cleaned with DuraPrep and drapes were applied.  We were unable to feel any bony structure.  An incision was made in the midline through the skin through a thick adipose tissue straight down to the lumbar area.  Muscle were retracted on the left side and x-rays showed that indeed we were right at the level of L4-5 and L5-1.  We brought the microscope into the  area for better visualization.  With the drill, we removed upper part of S1, the hemi lamina 5 and then the lower part of S1.  The patient had thick yellow ligament.  With that at the level 5-1 we found that the S1 nerve root was really attached to the floor.  The patient has quite a bit of adhesion.  Lysis was accomplished.  We were able to retract the S1 nerve medially and later we found a large herniated disk going into the body of S1.  Incision in the disk space was made and large amount of degenerative disk disease was removed.  At the end, we had a good decompression of the thecal sac.  At the level 4-5 we found that the L5 nerve root was swollen.  It was difficult to mobilize.  We found that she has a large central disc with a soft and chronic component.  Incision was made and decompression of the L4 and L5 nerve root was accomplished.  The area was irrigated.  Valsalva maneuver was negative.  Fentanyl and Depo-Medrol were left in the epidural space.  A drain was left in the  operative site, and the wound was closed with Vicryl and Steri-Strips.          ______________________________ Hilda Lias, M.D.     EB/MEDQ  D:  01/02/2014  T:  01/03/2014  Job:  [id number]

## 2014-01-04 NOTE — Discharge Summary (Signed)
Physician Discharge Summary  Patient ID: Karen Cooley MRN: [id number] DOB/AGE: [birth year] [age] y.o.  Admit date: 01/02/2014 Discharge date: 01/04/2014 left l4-5,51 hnp  Admission Diagnoses:  Discharge Diagnoses:  Active Problems:   Lumbar herniated disc   Discharged Condition: no weaskness  Hospital Course: surgery  Consults: none  Significant Diagnostic Studies: mri  Treatments: left 45,51 discectomies  Discharge Exam: Blood pressure 93/64, pulse 61, temperature 98.4 F (36.9 C), temperature source Oral, resp. rate 16, last menstrual period 11/02/2013, SpO2 93.00%. No weaskness  Disposition: 01-Home or Self Care     Medication List    ASK your doctor about these medications       benazepril 20 MG tablet  Commonly known as:  LOTENSIN  Take 20 mg by mouth at bedtime.     chlorthalidone 25 MG tablet  Commonly known as:  HYGROTON  Take 25 mg by mouth at bedtime.     diazepam 5 MG tablet  Commonly known as:  VALIUM  Take 5 mg by mouth every 6 (six) hours as needed for muscle spasms.     docusate sodium 250 MG capsule  Commonly known as:  COLACE  Take 250 mg by mouth every other day.     Oxycodone HCl 10 MG Tabs  Take 10 mg by mouth every 4 (four) hours as needed (for pain).         Signed: Karn CassisBOTERO,Tela Kotecki M 01/04/2014, 11:01 AM

## 2014-01-04 NOTE — Progress Notes (Signed)
Physical Therapy Treatment Patient Details Name: Karen LinkFelicia A Cooley MRN: [id number] DOB: 04/12/1981 Today's Date: 01/04/2014 Time: 4098-11910930-0945 PT Time Calculation (min): 15 min  PT Assessment / Plan / Recommendation  History of Present Illness 33 y.o. female [facility] for elective Left 4-5, 5-1 foraminotomy, laminotomy, and diskectomy;   PT Comments   Pt is POD #2 and moving well.  She continues to report left buttocks, posterior thigh, and today genitalia numbness.  She is walking without supervision with her RW and although is moving slowly it is on her own power.  She needs to continue to practice getting back into the bed as she needed the most help with this move.    Follow Up Recommendations  No PT follow up;Supervision for mobility/OOB     Does the patient have the potential to tolerate intense rehabilitation    NA  Barriers to Discharge   None      Equipment Recommendations  Rolling walker with 5" wheels    Recommendations for Other Services   None  Frequency Min 5X/week   Progress towards PT Goals Progress towards PT goals: Progressing toward goals  Plan Current plan remains appropriate    Precautions / Restrictions Precautions Precautions: Back Precaution Booklet Issued: Yes (comment) Precaution Comments: Pt able to report 3/3 back precautions   Pertinent Vitals/Pain See vitals flow sheet.     Mobility  Bed Mobility Overal bed mobility: Needs Assistance Bed Mobility: Sit to Sidelying Sit to sidelying: Mod assist General bed mobility comments: Mod assist to get to sidelying due to assist provided to both legs to get them into the bed.  Transfers Overall transfer level: Modified independent Equipment used: Rolling walker (2 wheeled) Ambulation/Gait Ambulation/Gait assistance: Modified independent (Device/Increase time) Ambulation Distance (Feet): 250 Feet Assistive device: Rolling walker (2 wheeled) Gait Pattern/deviations: Step-through  pattern;Shuffle Gait velocity: decreased General Gait Details: slow and cautious gait      PT Goals (current goals can now be found in the care plan section) Acute Rehab PT Goals Patient Stated Goal: to go home today  Visit Information  Last PT Received On: 01/04/14 Assistance Needed: +1 History of Present Illness: 33 [facility].o. [facility] for elective Left 4-5, 5-1 foraminotomy, laminotomy, and diskectomy;    Subjective Data  Subjective: Pt reports she has gone on several walks with RW this AM on her own.  Patient Stated Goal: to go home today   Cognition  Cognition Arousal/Alertness: Awake/alert Behavior During Therapy: WFL for tasks assessed/performed Overall Cognitive Status: Within Functional Limits for tasks assessed       End of Session PT - End of Session Activity Tolerance: Patient limited by pain Patient left: in bed;with call bell/phone within reach Nurse Communication: Mobility status     Lurena JoinerRebecca B. Layli Capshaw, PT, DPT [phone]   01/04/2014, 6:06 PM

## 2014-01-04 NOTE — Progress Notes (Signed)
Pt given D/C instructions with verbal understanding. Pt D/C'd home via wheelchair @ 1250 per MD order. Pt stable @ D/C and had no other needs at this time. Rema FendtAshley Lashunta Frieden, RN

## 2014-01-08 ENCOUNTER — Encounter (HOSPITAL_COMMUNITY): Payer: Self-pay | Admitting: Neurosurgery

## 2015-02-13 IMAGING — CR DG CHEST 2V
2 series · 2 of 2 positions shown · non-contrast
Comparison: None.

CLINICAL DATA: Preop

EXAM:
CHEST  2 VIEW

[w chest pa]
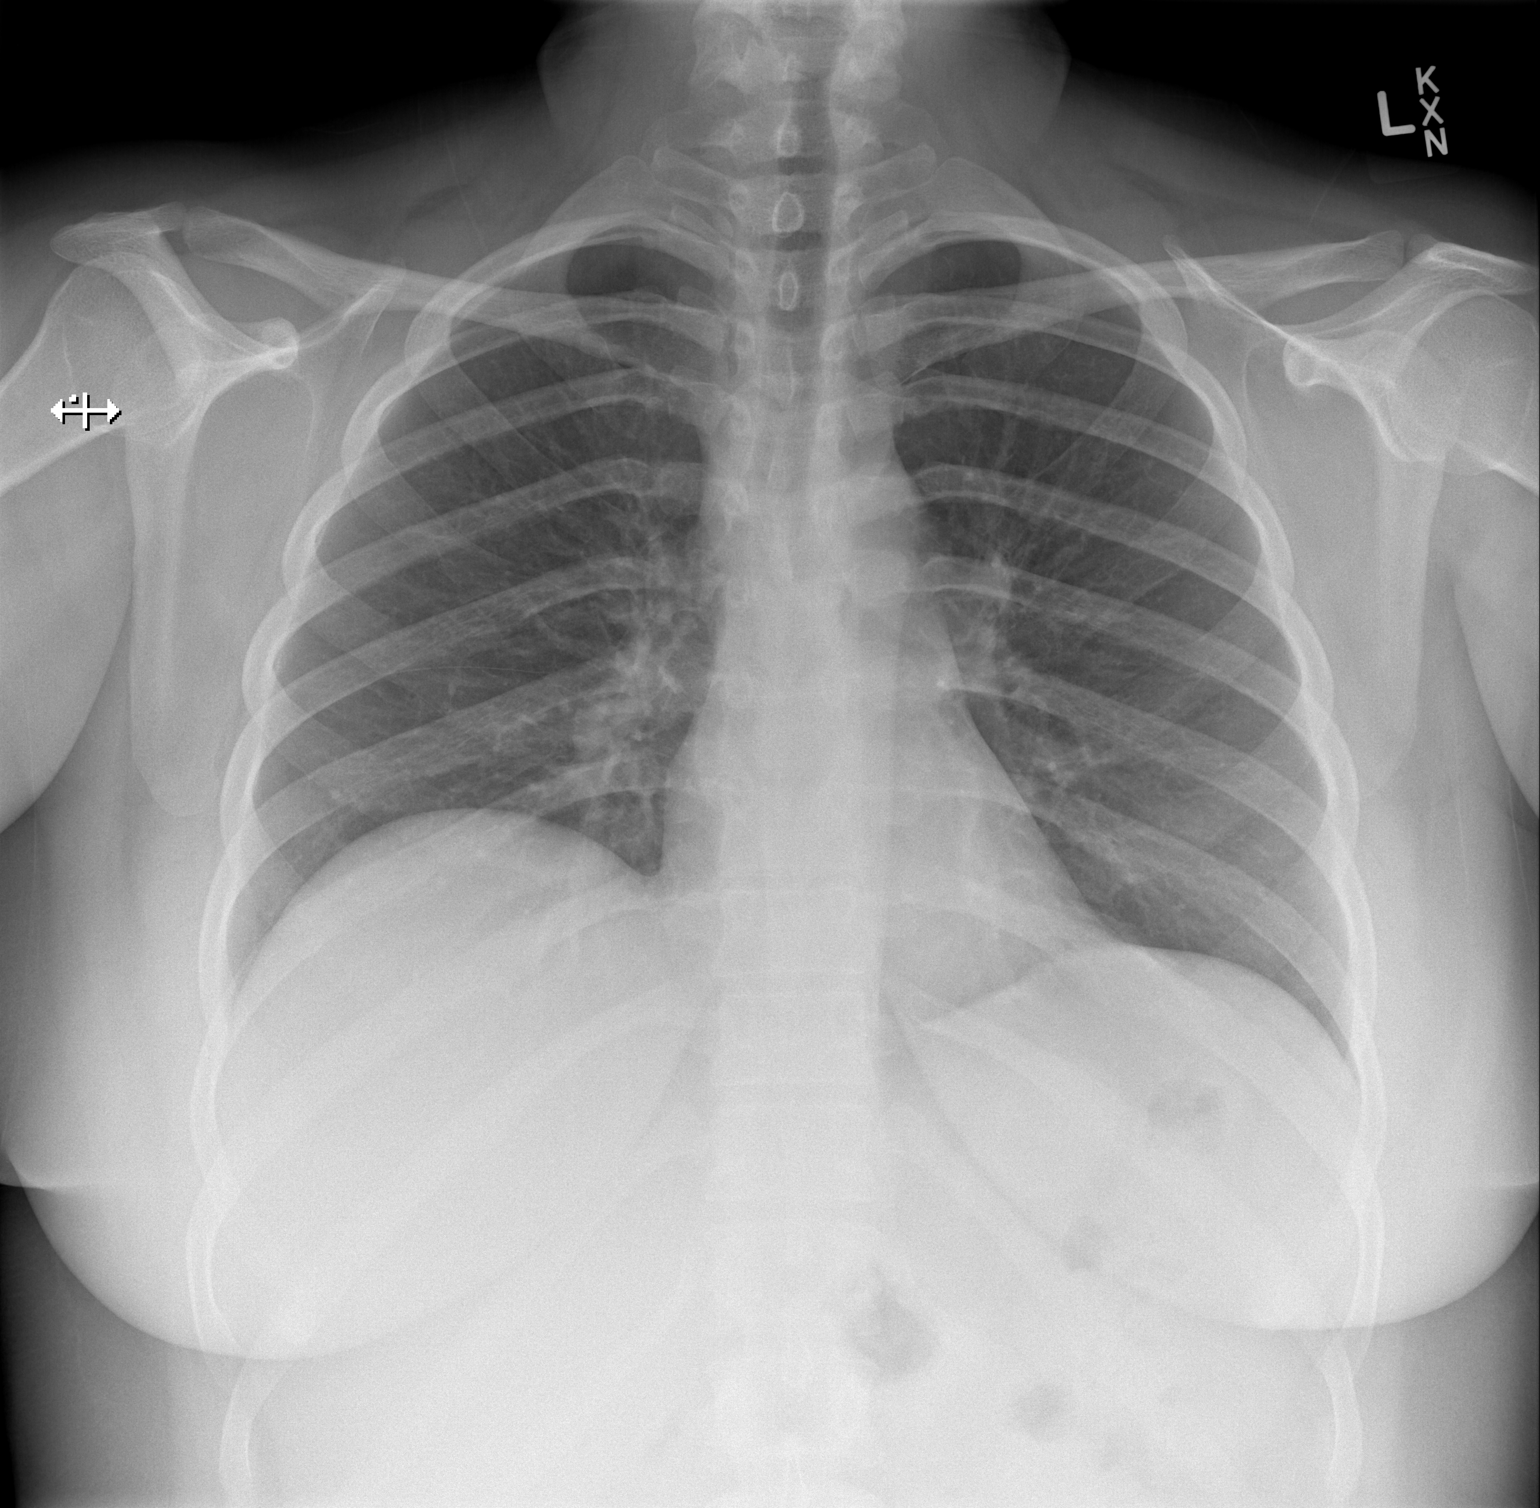

[w chest lat]
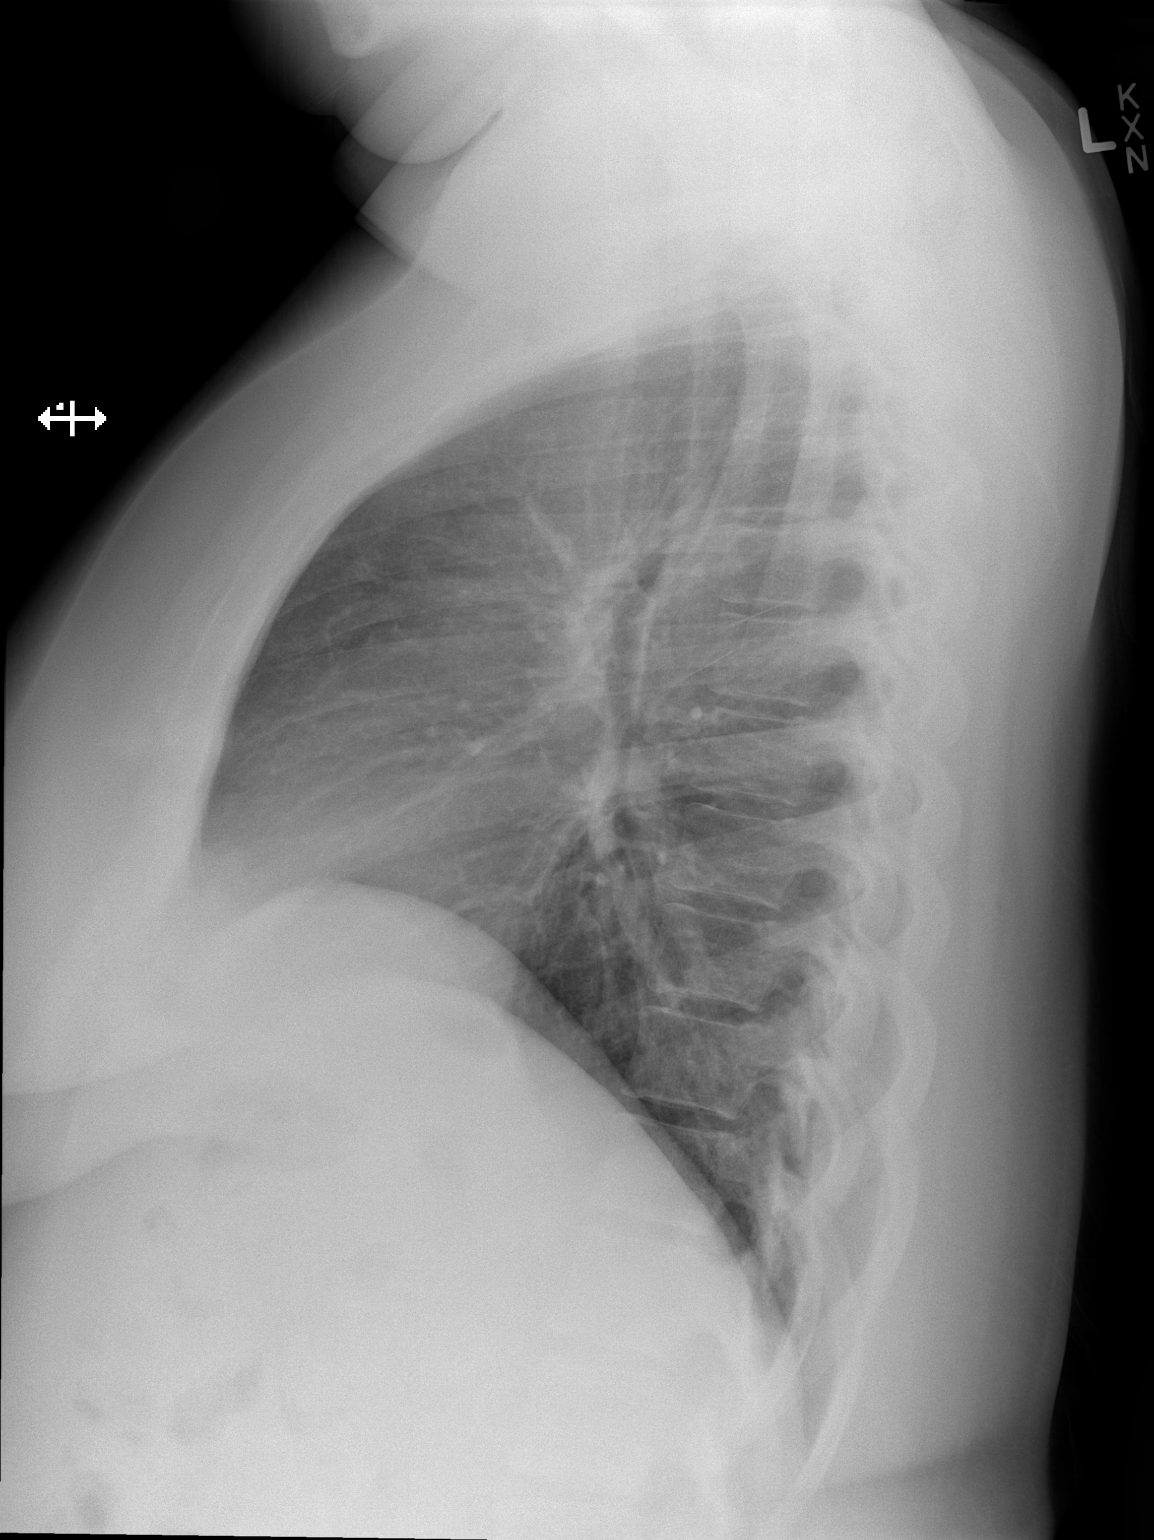

[2 of 2 positions shown; findings below may reference images not displayed]

FINDINGS: The heart size and mediastinal contours are within normal limits.
Both lungs are clear. The visualized skeletal structures are
unremarkable.
IMPRESSION: No active cardiopulmonary disease.

## 2017-03-28 ENCOUNTER — Ambulatory Visit: Payer: Medicaid Other | Admitting: Family Medicine

## 2017-11-14 ENCOUNTER — Emergency Department (HOSPITAL_COMMUNITY)
Admission: EM | Admit: 2017-11-14 | Discharge: 2017-11-15 | Disposition: A | Discharge status: Home / Self Care | Payer: Medicaid Other | Attending: Emergency Medicine | Admitting: Emergency Medicine

## 2017-11-14 ENCOUNTER — Encounter (HOSPITAL_COMMUNITY): Payer: Self-pay | Admitting: Emergency Medicine

## 2017-11-14 ENCOUNTER — Emergency Department (HOSPITAL_COMMUNITY): Payer: Medicaid Other

## 2017-11-14 ENCOUNTER — Other Ambulatory Visit: Payer: Self-pay

## 2017-11-14 DIAGNOSIS — N23 Unspecified renal colic: Secondary | ICD-10-CM

## 2017-11-14 DIAGNOSIS — B9689 Other specified bacterial agents as the cause of diseases classified elsewhere: Secondary | ICD-10-CM

## 2017-11-14 DIAGNOSIS — N760 Acute vaginitis: Secondary | ICD-10-CM | POA: Insufficient documentation

## 2017-11-14 DIAGNOSIS — F17210 Nicotine dependence, cigarettes, uncomplicated: Secondary | ICD-10-CM | POA: Insufficient documentation

## 2017-11-14 DIAGNOSIS — I10 Essential (primary) hypertension: Secondary | ICD-10-CM | POA: Insufficient documentation

## 2017-11-14 DIAGNOSIS — N390 Urinary tract infection, site not specified: Secondary | ICD-10-CM

## 2017-11-14 LAB — CBC
HEMATOCRIT: 44.7 % (ref 36.0–46.0)
HEMOGLOBIN: 14.1 g/dL (ref 12.0–15.0)
MCH: 29.9 pg (ref 26.0–34.0)
MCHC: 31.5 g/dL (ref 30.0–36.0)
MCV: 94.9 fL (ref 78.0–100.0)
Platelets: 282 10*3/uL (ref 150–400)
RBC: 4.71 MIL/uL (ref 3.87–5.11)
RDW: 14.6 % (ref 11.5–15.5)
WBC: 12.7 10*3/uL — AB (ref 4.0–10.5)

## 2017-11-14 LAB — COMPREHENSIVE METABOLIC PANEL
ALBUMIN: 3.9 g/dL (ref 3.5–5.0)
ALT: 45 U/L (ref 14–54)
ANION GAP: 8 (ref 5–15)
AST: 50 U/L — AB (ref 15–41)
Alkaline Phosphatase: 65 U/L (ref 38–126)
BUN: 8 mg/dL (ref 6–20)
CHLORIDE: 102 mmol/L (ref 101–111)
CO2: 26 mmol/L (ref 22–32)
Calcium: 9.2 mg/dL (ref 8.9–10.3)
Creatinine, Ser: 0.91 mg/dL (ref 0.44–1.00)
GFR calc Af Amer: >60 mL/min (ref >60)
GLUCOSE: 107 mg/dL — AB (ref 65–99)
POTASSIUM: 3.3 mmol/L — AB (ref 3.5–5.1)
Sodium: 136 mmol/L (ref 135–145)
TOTAL PROTEIN: 7.6 g/dL (ref 6.5–8.1)
Total Bilirubin: 0.5 mg/dL (ref 0.3–1.2)

## 2017-11-14 LAB — PREGNANCY, URINE: PREG TEST UR: NEGATIVE

## 2017-11-14 LAB — URINALYSIS, ROUTINE W REFLEX MICROSCOPIC
BILIRUBIN URINE: NEGATIVE
Glucose, UA: NEGATIVE mg/dL
KETONES UR: NEGATIVE mg/dL
NITRITE: NEGATIVE
PH: 6 (ref 5.0–8.0)
PROTEIN: NEGATIVE mg/dL
Specific Gravity, Urine: 1.01 (ref 1.005–1.030)

## 2017-11-14 LAB — LIPASE, BLOOD: LIPASE: 61 U/L — AB (ref 11–51)

## 2017-11-14 MED ORDER — IOPAMIDOL (ISOVUE-300) INJECTION 61%
100.0000 mL | Freq: Once | INTRAVENOUS | Status: AC | PRN
  Administered 2017-11-14: 100 mL via INTRAVENOUS

## 2017-11-14 NOTE — ED Notes (Signed)
Pt c/o right side lower abd pain that started earlier today but has went away now, last bowel movement was today,

## 2017-11-14 NOTE — ED Triage Notes (Signed)
abd pain in rlq x all day today.  Hurts more when walking.  Denies any urinary s/s

## 2017-11-14 NOTE — ED Provider Notes (Signed)
[facility] Provider Note   CSN: [id number] Arrival date & time: 11/14/17  1927     History   Chief Complaint Chief Complaint  Patient presents with  . Abdominal Pain    HPI Karen Cooley is a 36 y.o. female.  Patient with right-sided lower abdominal pain that radiates to her right back.  Started around 8 AM.  He has been constant all day but waxing and waning in severity.  Worse with movement and walking and better with rest.  She has no pain right now after resting for several hours in the waiting room.  Denies any pain with urination or hematuria.  Denies any abnormal vaginal bleeding or discharge.  Last menstrual cycle last week.  Denies any diarrhea worse than her baseline.  Previous cholecystectomy.  The previous ectopic pregnancy and states one tube was removed but she does not know which one.  Good appetite.  She has not had this pain in the past.   The history is provided by the patient.    Past Medical History:  Diagnosis Date  . Back pain    herniated nucleus pulposus  . Confusion    related to meds she is on   . Constipation    Colace taken 10days as instructed   . Depression    related to pain but no meds required  . Headache(784.0)   . Hemorrhoids   . History of bronchitis    last time unknown   . Hypertension    takes Benazepril and Chlorthalidone nightly  . Insomnia    recently and related to stress  . Osteoarthritis   . Pregnancy, ectopic   . Sciatica   . Weakness    numbness and tingling to the left leg    Patient Active Problem List   Diagnosis Date Noted  . Lumbar herniated disc 01/02/2014    Past Surgical History:  Procedure Laterality Date  . CHOLECYSTECTOMY    . ECTOPIC PREGNANCY SURGERY  between 2006 and 2008  . LUMBAR LAMINECTOMY/DECOMPRESSION MICRODISCECTOMY 2 LEVELS Left 01/02/2014   Performed by Karn CassisBotero, Ernesto M, [facility]    OB History    No data available       Home Medications     Prior to Admission medications   Not on File    Family History No family history on file.  Social History Social History   Tobacco Use  . Smoking status: Current Every Day Smoker    Packs/day: 1.00    Years: 15.00    Pack years: 15.00    Types: Cigarettes  Substance Use Topics  . Alcohol use: Yes    Comment: twice a year  . Drug use: No     Allergies   Patient has no known allergies.   Review of Systems Review of Systems  Constitutional: Negative for activity change, appetite change and fever.  HENT: Negative for congestion and rhinorrhea.   Eyes: Negative for visual disturbance.  Respiratory: Negative for cough, chest tightness and shortness of breath.   Cardiovascular: Negative for chest pain.  Gastrointestinal: Positive for abdominal pain, diarrhea and nausea. Negative for vomiting.  Genitourinary: Negative for dysuria and hematuria.  Musculoskeletal: Positive for back pain. Negative for arthralgias and myalgias.  Skin: Negative for rash and wound.  Neurological: Negative for dizziness, weakness and headaches.   all other systems are negative except as noted in the HPI and PMH.     Physical Exam Updated Vital Signs BP Marland Kitchen(!)  144/79 (BP Location: Left Arm)   Pulse 69   Temp 99.2 F (37.3 C) (Oral)   Resp 20   Ht 5\' 3"  (1.6 m)   Wt 99.8 kg (220 lb)   LMP  (Within Weeks)   SpO2 100%   BMI 38.97 kg/m   Physical Exam  Constitutional: She is oriented to person, place, and time. She appears well-developed and well-nourished. No distress.  comfortable  HENT:  Head: Normocephalic and atraumatic.  Mouth/Throat: Oropharynx is clear and moist. No oropharyngeal exudate.  Eyes: Conjunctivae and EOM are normal. Pupils are equal, round, and reactive to light.  Neck: Normal range of motion. Neck supple.  No meningismus.  Cardiovascular: Normal rate, regular rhythm, normal heart sounds and intact distal pulses.  No murmur heard. Pulmonary/Chest: Effort normal and  breath sounds normal. No respiratory distress.  Abdominal: Soft. There is tenderness. There is no rebound and no guarding.  Obese. Mild RLQ. No guarding or rebound  Genitourinary:  Genitourinary Comments: Chaperone present.  Normal external genitalia. White discharge in vaginal vault.  No CMT.  No lateralizing adnexal pain  Musculoskeletal: Normal range of motion. She exhibits no edema or tenderness.  Neurological: She is alert and oriented to person, place, and time. No cranial nerve deficit. She exhibits normal muscle tone. Coordination normal.   5/5 strength throughout. CN 2-12 intact.Equal grip strength.   Skin: Skin is warm.  Psychiatric: She has a normal mood and affect. Her behavior is normal.  Nursing note and vitals reviewed.    ED Treatments / Results  Labs (all labs ordered are listed, but only abnormal results are displayed) Labs Reviewed  WET PREP, GENITAL - Abnormal; Notable for the following components:      Result Value   Clue Cells Wet Prep HPF POC PRESENT (*)    WBC, Wet Prep HPF POC MANY (*)    All other components within normal limits  LIPASE, BLOOD - Abnormal; Notable for the following components:   Lipase 61 (*)    All other components within normal limits  COMPREHENSIVE METABOLIC PANEL - Abnormal; Notable for the following components:   Potassium 3.3 (*)    Glucose, Bld 107 (*)    AST 50 (*)    All other components within normal limits  CBC - Abnormal; Notable for the following components:   WBC 12.7 (*)    All other components within normal limits  URINALYSIS, ROUTINE W REFLEX MICROSCOPIC - Abnormal; Notable for the following components:   APPearance CLOUDY (*)    Hgb urine dipstick SMALL (*)    Leukocytes, UA MODERATE (*)    Bacteria, UA FEW (*)    Squamous Epithelial / LPF 6-30 (*)    All other components within normal limits  URINE CULTURE  PREGNANCY, URINE  GC/CHLAMYDIA PROBE AMP ([place]) NOT AT [facility]    EKG  EKG Interpretation None         Radiology Ct Abdomen Pelvis W Contrast  Result Date: 11/15/2017 CLINICAL DATA:  Right-sided lower abdominal pain starting earlier today. Last bowel movement today. EXAM: CT ABDOMEN AND PELVIS WITH CONTRAST TECHNIQUE: Multidetector CT imaging of the abdomen and pelvis was performed using the standard protocol following bolus administration of intravenous contrast. CONTRAST:  ISOVUE-300 IOPAMIDOL (ISOVUE-300) INJECTION 61% COMPARISON:  05/19/2015 FINDINGS: Lower chest: Lung bases are clear. Hepatobiliary: Surgical absence of the gallbladder. No bile duct dilatation. Diffuse fatty infiltration of the liver. No focal liver lesions. Pancreas: Unremarkable. No pancreatic ductal dilatation or surrounding  inflammatory changes. Spleen: Normal in size without focal abnormality. Adrenals/Urinary Tract: No adrenal gland nodules. 5 mm stone in the lower pole right kidney. Renal nephrograms are homogeneous. 4 mm stone in the proximal left ureter just distal to the ureteropelvic junction. Minimal if any left hydronephrosis. The distal left ureter is decompressed. Bladder wall is not thickened and no bladder stones are seen. Stomach/Bowel: Stomach is within normal limits. Appendix appears normal. No evidence of bowel wall thickening, distention, or inflammatory changes. Vascular/Lymphatic: No significant vascular findings are present. No enlarged abdominal or pelvic lymph nodes. Reproductive: Uterus and bilateral adnexa are unremarkable. Other: No abdominal wall hernia or abnormality. No abdominopelvic ascites. Musculoskeletal: No acute or significant osseous findings. IMPRESSION: 1. Prominent air stone in the proximal left ureter with mild to moderate proximal obstruction. Nonobstructing stone in the lower pole right kidney. 2. Diffuse fatty infiltration of the liver. 3. No evidence of bowel obstruction or inflammation. Electronically Signed   By: Burman NievesWilliam  Stevens M.D.   On: 11/15/2017 00:04     Procedures Procedures (including critical care time)  Medications Ordered in ED Medications  cefTRIAXone (ROCEPHIN) 1 g in dextrose 5 % 50 mL IVPB (1 g Intravenous New Bag/Given 11/15/17 0041)  sodium chloride 0.9 % bolus 1,000 mL (1,000 mLs Intravenous New Bag/Given 11/15/17 0041)  iopamidol (ISOVUE-300) 61 % injection 100 mL (100 mLs Intravenous Contrast Given 11/14/17 2342)     Initial Impression / Assessment and Plan / ED Course  I have reviewed the triage vital signs and the nursing notes.  Pertinent labs & imaging results that were available during my care of the patient were reviewed by me and considered in my medical decision making (see chart for details).    RLQ pain since 8am.  Comfortable appearing. Abdomen without peritoneal signs.  HCG negative.  Pelvic exam is benign.  UA is equivocal for infection.  Culture sent.  CT scan shows normal appendix.  Does show left-sided proximal ureteral stone.  It is possible patient could have passed a stone on the right.  There are no adnexal lesions.  Low suspicion for ovarian torsion or TOA. D/w Dr. Andria MeuseStevens "air stone" is dictation error. No emphysema seen. CBC shows apparent chronic leukocytosis.  Case discussed with Dr. Sherron MondayMacDiarmid urology.  Patient will be treated for UTI plus kidney stone.  stone does not change treatment per Dr. Sherron MondayMacDiarmid.  Precautions discussed including development of fever, persistent pain, vomiting or any other concerns.  Patient will be treated for UTI as well as bacterial vaginosis.  Will also treat for her kidney stone.  She understands to return with worsening pain or fever and to follow-up with urology.  Return precautions discussed. Final Clinical Impressions(s) / ED Diagnoses   Final diagnoses:  Ureteral colic  Bacterial vaginosis  Urinary tract infection without hematuria, site unspecified    ED Discharge Orders    None       Berneice Zettlemoyer, Jeannett SeniorStephen, MD 11/15/17 [phone]

## 2017-11-15 LAB — WET PREP, GENITAL
Sperm: NONE SEEN
TRICH WET PREP: NONE SEEN
YEAST WET PREP: NONE SEEN

## 2017-11-15 MED ORDER — CEFTRIAXONE SODIUM 1 G IJ SOLR
1.0000 g | Freq: Once | INTRAMUSCULAR | Status: AC
  Administered 2017-11-15: 1 g via INTRAVENOUS
  Filled 2017-11-15: qty 10

## 2017-11-15 MED ORDER — SODIUM CHLORIDE 0.9 % IV BOLUS (SEPSIS)
1000.0000 mL | Freq: Once | INTRAVENOUS | Status: AC
  Administered 2017-11-15: 1000 mL via INTRAVENOUS

## 2017-11-15 MED ORDER — HYDROCODONE-ACETAMINOPHEN 5-325 MG PO TABS: 1.0000 | ORAL_TABLET | ORAL | 0 refills | Status: DC | PRN

## 2017-11-15 MED ORDER — METRONIDAZOLE 500 MG PO TABS: 500.0000 mg | ORAL_TABLET | Freq: Two times a day (BID) | ORAL | 0 refills | Status: DC

## 2017-11-15 MED ORDER — IBUPROFEN 800 MG PO TABS: 800.0000 mg | ORAL_TABLET | Freq: Three times a day (TID) | ORAL | 0 refills | Status: DC

## 2017-11-15 MED ORDER — CEPHALEXIN 500 MG PO CAPS: 500.0000 mg | ORAL_CAPSULE | Freq: Four times a day (QID) | ORAL | 0 refills | Status: DC

## 2017-11-15 NOTE — Discharge Instructions (Signed)
You have a kidney stone and probably a urinary tract infection.  Take the antibiotic as prescribed.  Return to the ED if you develop worsening pain, vomiting, fever or any other concerns.  Follow-up with the urologist.  Do not drink alcohol while taking the metronidazole medication for bacterial vaginosis.

## 2017-11-15 NOTE — ED Notes (Signed)
Pt updated on plan of care, expressed understanding,.

## 2017-11-16 LAB — URINE CULTURE

## 2017-11-16 LAB — GC/CHLAMYDIA PROBE AMP (~~LOC~~) NOT AT ARMC
CHLAMYDIA, DNA PROBE: NEGATIVE
NEISSERIA GONORRHEA: NEGATIVE

## 2019-01-03 IMAGING — CT CT ABD-PELV W/ CM
2 of 4 series · 16 of 46 positions shown, 18 images · IV contrast (Isovue)
Comparison: 05/19/2015

ADDENDUM:
Correction to impression, item 1: 4 mm stone in the proximal left
ureter with mild to

moderate proximal obstruction.
CLINICAL DATA: Right-sided lower abdominal pain starting earlier
today. Last bowel movement today.
EXAM:
CT ABDOMEN AND PELVIS WITH CONTRAST
TECHNIQUE: Multidetector CT imaging of the abdomen and pelvis was performed
using the standard protocol following bolus administration of
intravenous contrast.
CONTRAST:  100mL GYCL45-0AA IOPAMIDOL (GYCL45-0AA) INJECTION 61%

[Series 2: axial st · axial · 0.98mm/px · z∈[-325,+145]mm · 13 of 104 slices shown, 15 images]
[im 5/104  soft-tissue]
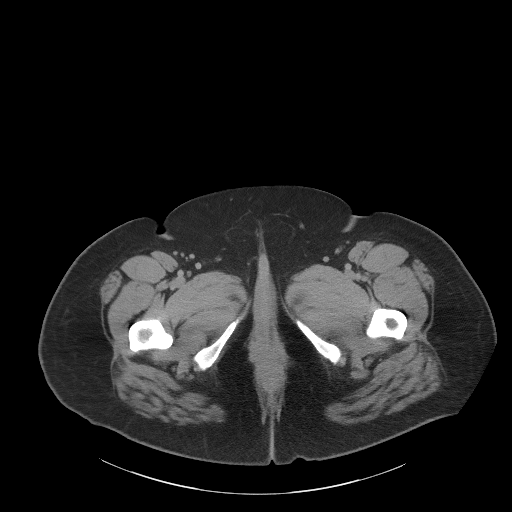
[im 5/104  bone]
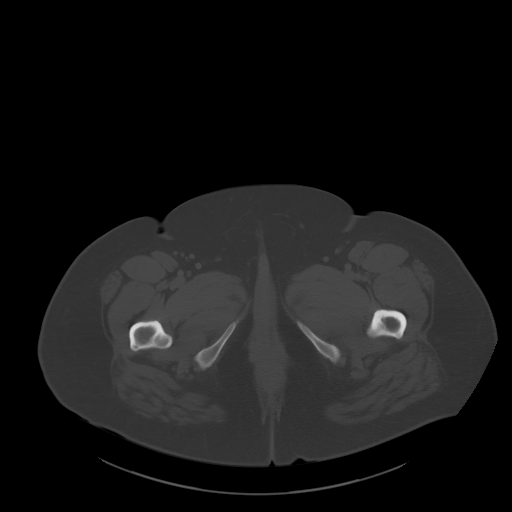
[im 14/104  soft-tissue]
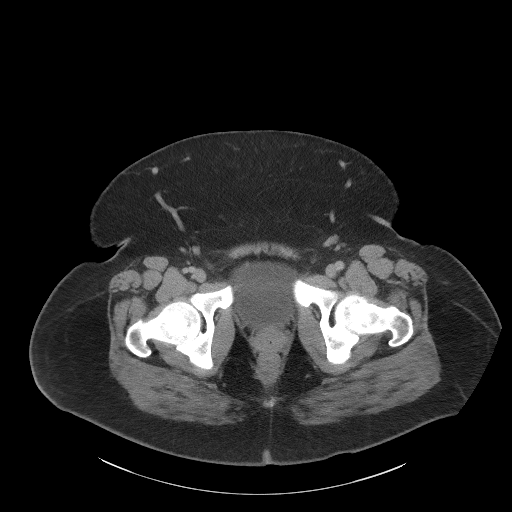
[im 23/104  soft-tissue]
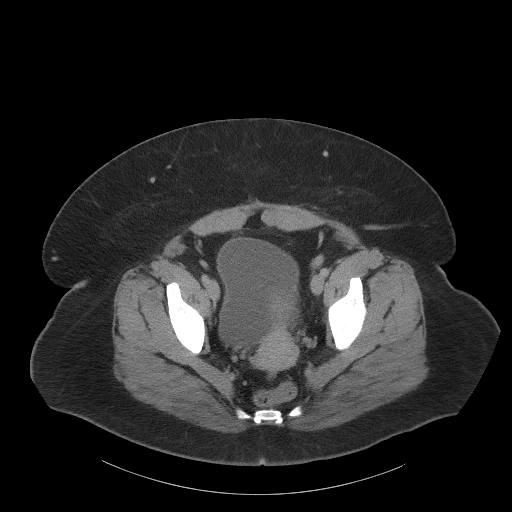
[im 27/104  soft-tissue]
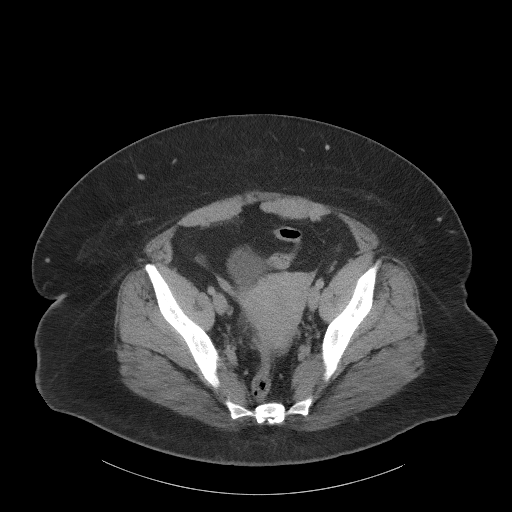
[im 36/104  soft-tissue]
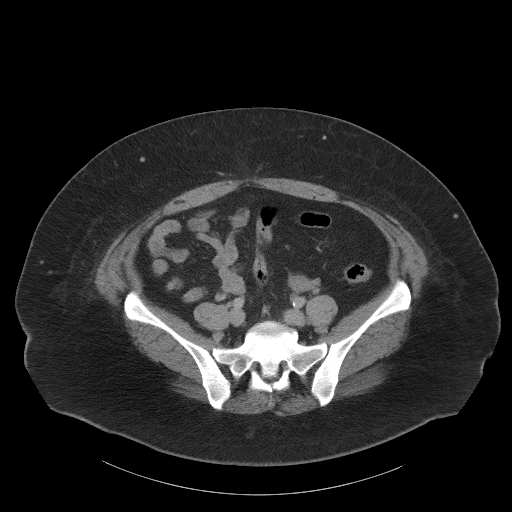
[im 45/104  soft-tissue]
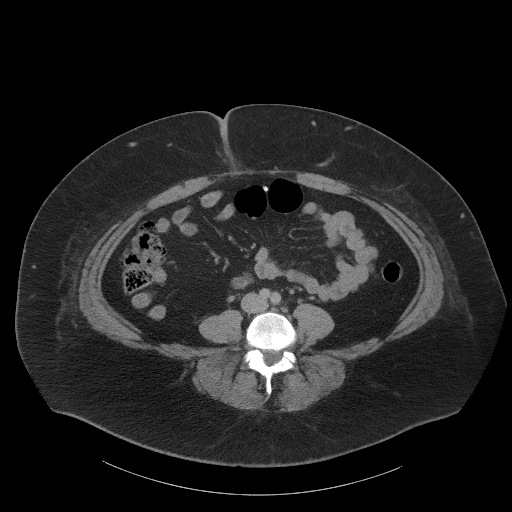
[im 54/104  soft-tissue]
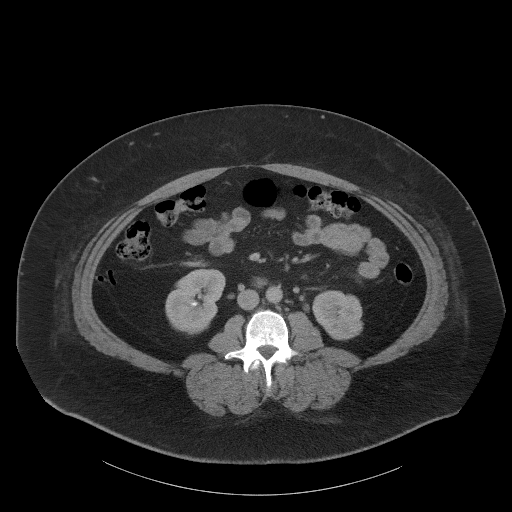
[im 59/104  soft-tissue]
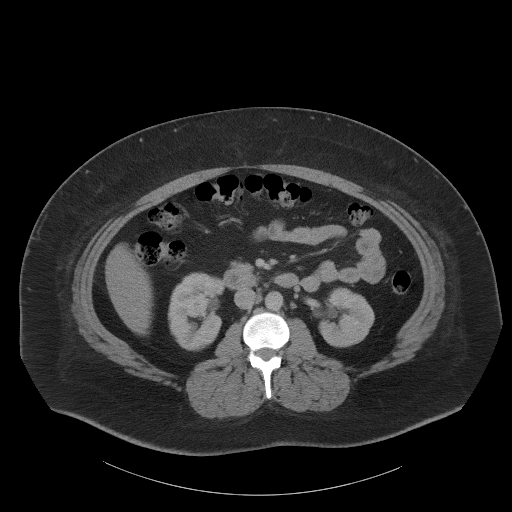
[im 68/104  soft-tissue]
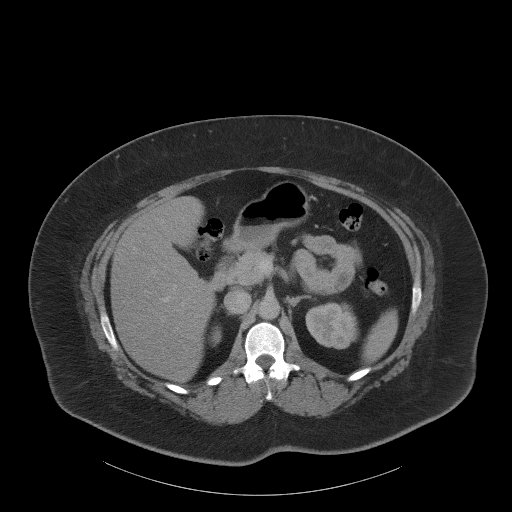
[im 68/104  bone]
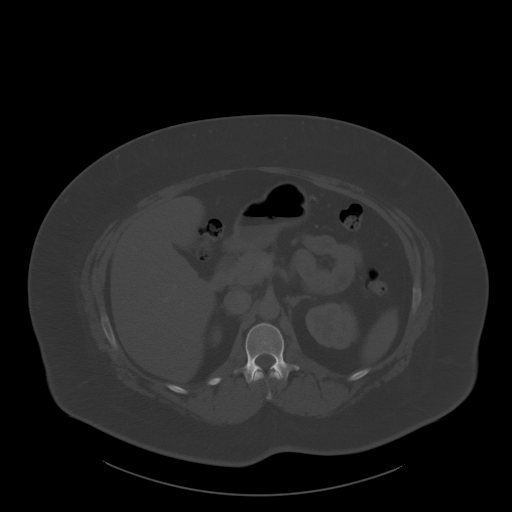
[im 77/104  soft-tissue]
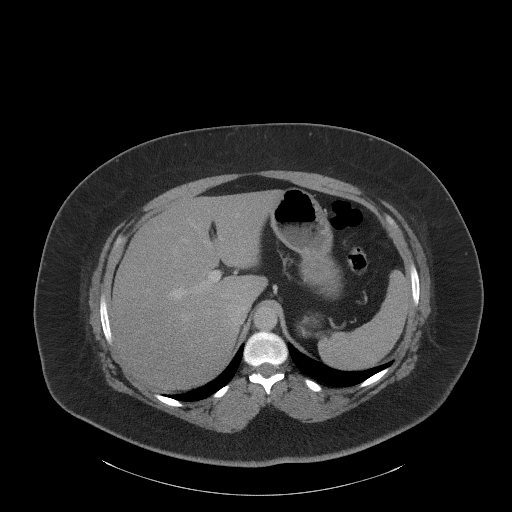
[im 81/104  soft-tissue]
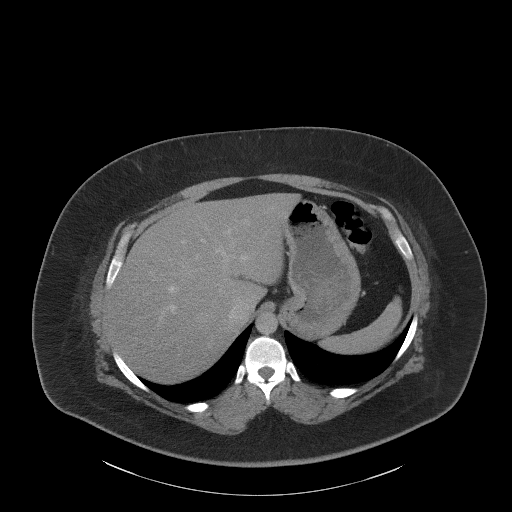
[im 90/104  soft-tissue]
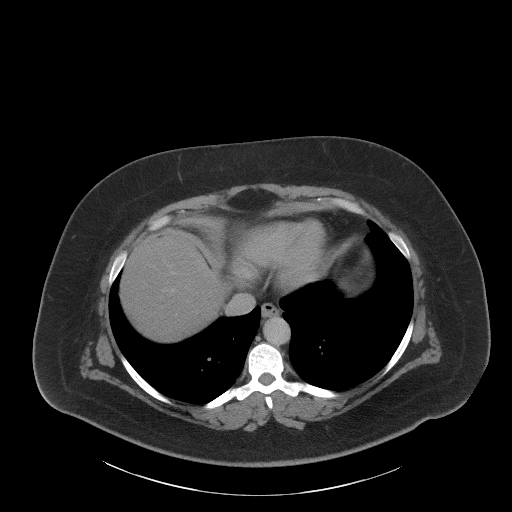
[im 99/104  soft-tissue]
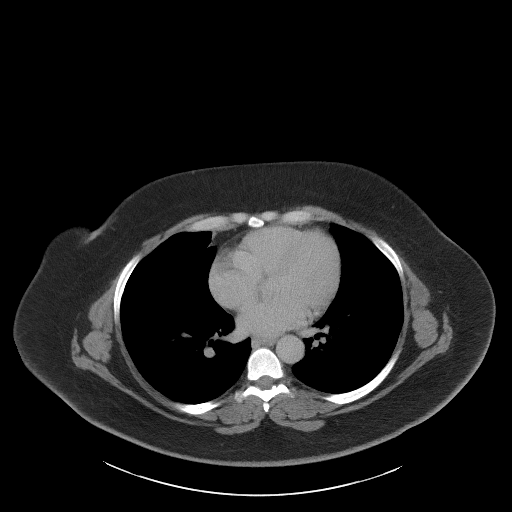

[Series 5: coronal st · coronal · 0.90mm/px · 3 of 117 slices shown]
[im 39/117  soft-tissue]
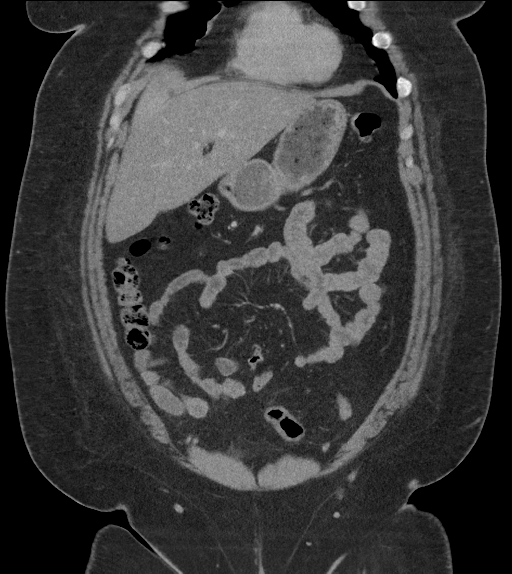
[im 52/117  soft-tissue]
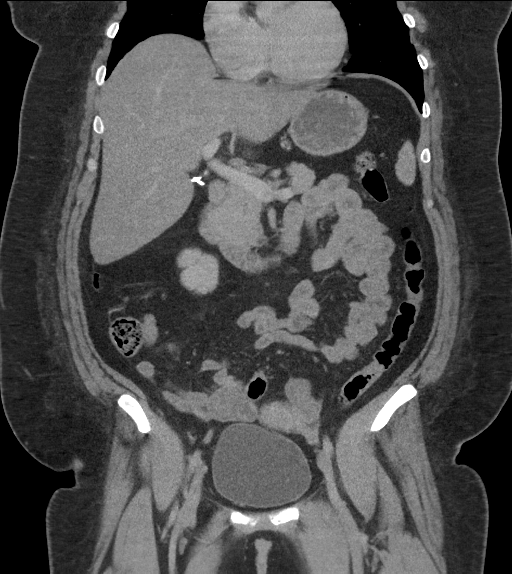
[im 65/117  soft-tissue]
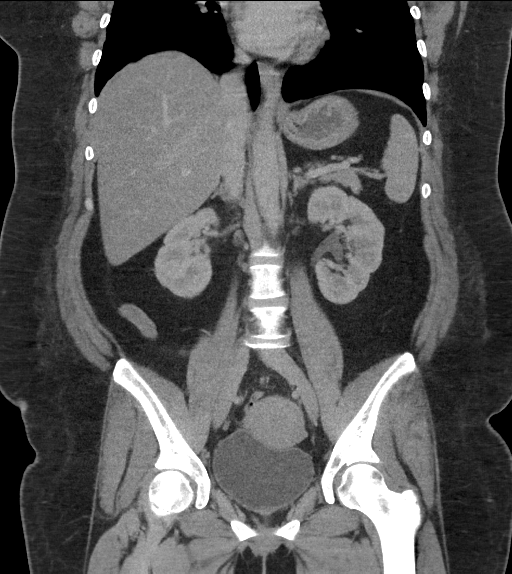

[16 of 46 positions shown; findings below may reference images not displayed]

FINDINGS: Lower chest: Lung bases are clear.

Hepatobiliary: Surgical absence of the gallbladder. No bile duct
dilatation. Diffuse fatty infiltration of the liver. No focal liver
lesions.

Pancreas: Unremarkable. No pancreatic ductal dilatation or
surrounding inflammatory changes.

Spleen: Normal in size without focal abnormality.

Adrenals/Urinary Tract: No adrenal gland nodules. 5 mm stone in the
lower pole right kidney. Renal nephrograms are homogeneous. 4 mm
stone in the proximal left ureter just distal to the ureteropelvic
junction. Minimal if any left hydronephrosis. The distal left ureter
is decompressed. Bladder wall is not thickened and no bladder stones
are seen.

Stomach/Bowel: Stomach is within normal limits. Appendix appears
normal. No evidence of bowel wall thickening, distention, or
inflammatory changes.

Vascular/Lymphatic: No significant vascular findings are present. No
enlarged abdominal or pelvic lymph nodes.

Reproductive: Uterus and bilateral adnexa are unremarkable.

Other: No abdominal wall hernia or abnormality. No abdominopelvic
ascites.

Musculoskeletal: No acute or significant osseous findings.
IMPRESSION: 1. Prominent air stone in the proximal left ureter with mild to
moderate proximal obstruction. Nonobstructing stone in the lower
pole right kidney.
2. Diffuse fatty infiltration of the liver.
3. No evidence of bowel obstruction or inflammation.

## 2019-06-07 ENCOUNTER — Emergency Department (HOSPITAL_COMMUNITY)
Admission: EM | Admit: 2019-06-07 | Discharge: 2019-06-08 | Disposition: A | Discharge status: Home / Self Care | Payer: Medicaid Other | Attending: Emergency Medicine | Admitting: Emergency Medicine

## 2019-06-07 DIAGNOSIS — M5410 Radiculopathy, site unspecified: Secondary | ICD-10-CM

## 2019-06-07 DIAGNOSIS — M5416 Radiculopathy, lumbar region: Secondary | ICD-10-CM | POA: Insufficient documentation

## 2019-06-07 DIAGNOSIS — I10 Essential (primary) hypertension: Secondary | ICD-10-CM | POA: Insufficient documentation

## 2019-06-07 DIAGNOSIS — F17210 Nicotine dependence, cigarettes, uncomplicated: Secondary | ICD-10-CM | POA: Insufficient documentation

## 2019-06-07 NOTE — ED Triage Notes (Signed)
Pt awoke with lower back pain and pain down right leg, denies injury or trauma.  Pt reports fusion surgery several years ago.

## 2019-06-08 ENCOUNTER — Encounter (HOSPITAL_COMMUNITY): Payer: Self-pay

## 2019-06-08 ENCOUNTER — Other Ambulatory Visit: Payer: Self-pay

## 2019-06-08 MED ORDER — NAPROXEN 500 MG PO TABS: 500.0000 mg | ORAL_TABLET | Freq: Two times a day (BID) | ORAL | 0 refills | Status: AC

## 2019-06-08 MED ORDER — HYDROCODONE-ACETAMINOPHEN 5-325 MG PO TABS: 1.0000 | ORAL_TABLET | Freq: Four times a day (QID) | ORAL | 0 refills | Status: DC | PRN

## 2019-06-08 MED ORDER — HYDROCODONE-ACETAMINOPHEN 5-325 MG PO TABS
2.0000 | ORAL_TABLET | Freq: Once | ORAL | Status: AC
  Administered 2019-06-08: 2 via ORAL
  Filled 2019-06-08: qty 2

## 2019-06-08 MED ORDER — KETOROLAC TROMETHAMINE 60 MG/2ML IM SOLN
60.0000 mg | Freq: Once | INTRAMUSCULAR | Status: AC
  Administered 2019-06-08: 60 mg via INTRAMUSCULAR
  Filled 2019-06-08: qty 2

## 2019-06-08 NOTE — ED Provider Notes (Signed)
[facility]   CSN: [id number] Arrival date & time: 06/07/19  2349     History   Chief Complaint Chief Complaint  Patient presents with  . Back Pain    HPI Karen Cooley is a 38 y.o. female.     Patient is a 38 year old female with history of hypertension and prior back surgery.  She presents today with complaints of pain in her back/right buttock that radiates into her right leg.  This began 1 week ago in the absence of any injury or trauma.  She denies any bowel or bladder complaints.  She denies any weakness.  The history is provided by the patient.  Back Pain Location:  Lumbar spine and sacro-iliac joint Quality:  Aching Radiates to:  R posterior upper leg Pain severity:  Severe Pain is:  Same all the time Onset quality:  Gradual Duration:  1 week Timing:  Constant Progression:  Worsening Chronicity:  New Relieved by:  Nothing Worsened by:  Ambulation and palpation Ineffective treatments:  Ibuprofen Associated symptoms: no abdominal pain, no numbness and no weakness     Past Medical History:  Diagnosis Date  . Back pain    herniated nucleus pulposus  . Confusion    related to meds she is on   . Constipation    Colace taken 10days as instructed   . Depression    related to pain but no meds required  . Headache(784.0)   . Hemorrhoids   . History of bronchitis    last time unknown   . Hypertension    takes Benazepril and Chlorthalidone nightly  . Insomnia    recently and related to stress  . Osteoarthritis   . Pregnancy, ectopic   . Sciatica   . Weakness    numbness and tingling to the left leg    Patient Active Problem List   Diagnosis Date Noted  . Lumbar herniated disc 01/02/2014    Past Surgical History:  Procedure Laterality Date  . CHOLECYSTECTOMY    . ECTOPIC PREGNANCY SURGERY  between 2006 and 2008  . LUMBAR LAMINECTOMY/DECOMPRESSION MICRODISCECTOMY Left 01/02/2014   Procedure: LUMBAR  LAMINECTOMY/DECOMPRESSION MICRODISCECTOMY 2 LEVELS;  Surgeon: Karn CassisErnesto M Botero, MD;  Location: [facility];  Service: Neurosurgery;  Laterality: Left;  Left L4-5 L5-S1 Microdiskectomy     OB History   No obstetric history on file.      Home Medications    Prior to Admission medications   Medication Sig Start Date End Date Taking? Authorizing Provider  HYDROcodone-acetaminophen (NORCO/VICODIN) 5-325 MG tablet Take 1 tablet every 4 (four) hours as needed by mouth. 11/15/17  Yes Rancour, Jeannett SeniorStephen, MD  ibuprofen (ADVIL,MOTRIN) 800 MG tablet Take 1 tablet (800 mg total) 3 (three) times daily by mouth. 11/15/17  Yes Rancour, Jeannett SeniorStephen, MD  cephALEXin (KEFLEX) 500 MG capsule Take 1 capsule (500 mg total) 4 (four) times daily by mouth. 11/15/17   Rancour, Jeannett SeniorStephen, MD  metroNIDAZOLE (FLAGYL) 500 MG tablet Take 1 tablet (500 mg total) 2 (two) times daily by mouth. 11/15/17   Glynn Octaveancour, Stephen, MD    Family History No family history on file.  Social History Social History   Tobacco Use  . Smoking status: Current Every Day Smoker    Packs/day: 1.00    Years: 15.00    Pack years: 15.00    Types: Cigarettes  . Smokeless tobacco: Never Used  Substance Use Topics  . Alcohol use: Yes    Comment: twice a year  .  Drug use: No     Allergies   Patient has no known allergies.   Review of Systems Review of Systems  Gastrointestinal: Negative for abdominal pain.  Musculoskeletal: Positive for back pain.  Neurological: Negative for weakness and numbness.  All other systems reviewed and are negative.    Physical Exam Updated Vital Signs BP (!) 156/92 (BP Location: Left Arm)   Pulse 74   Temp 98.2 F (36.8 C) (Oral)   Resp 19   Ht 5\' 3"  (1.6 m)   Wt 99.8 kg   LMP 05/30/2019 (Approximate)   SpO2 97%   BMI 38.97 kg/m   Physical Exam Vitals signs and nursing note reviewed.  Constitutional:      General: She is not in acute distress.    Appearance: She is well-developed. She is  not diaphoretic.  HENT:     Head: Normocephalic and atraumatic.  Neck:     Musculoskeletal: Normal range of motion and neck supple.  Pulmonary:     Effort: Pulmonary effort is normal.  Musculoskeletal: Normal range of motion.     Comments: There is ttp in the right lumbar region and buttock.  Skin:    General: Skin is warm and dry.  Neurological:     Mental Status: She is alert and oriented to person, place, and time.     Comments: Strength is 5/5 in both lower extremities.  DTR's are 1+ and symmetrical in both lower extremities.      ED Treatments / Results  Labs (all labs ordered are listed, but only abnormal results are displayed) Labs Reviewed - No data to display  EKG    Radiology No results found.  Procedures Procedures (including critical care time)  Medications Ordered in ED Medications  ketorolac (TORADOL) injection 60 mg (has no administration in time range)  HYDROcodone-acetaminophen (NORCO/VICODIN) 5-325 MG per tablet 2 tablet (has no administration in time range)     Initial Impression / Assessment and Plan / ED Course  I have reviewed the triage vital signs and the nursing notes.  Pertinent labs & imaging results that were available during my care of the patient were reviewed by me and considered in my medical decision making (see chart for details).  Patient with low back pain that is radicular in nature.  There are no red flags that would suggest an emergent situation.  Will be treated with nsaids, pain meds, and follow up with pcp/neurosurgery if not improving.  Final Clinical Impressions(s) / ED Diagnoses   Final diagnoses:  None    ED Discharge Orders    None       Veryl Speak, MD 06/08/19 [phone]

## 2019-06-08 NOTE — ED Notes (Addendum)
Pt wheeled to waiting room. Pt verbalized understanding of discharge instructions.   

## 2019-06-08 NOTE — Discharge Instructions (Addendum)
Naproxen as prescribed.  Hydrocodone as prescribed as needed for pain not relieved with naproxen.  Follow-up with your primary doctor or neurosurgeon if symptoms or not improving in the next week.

## 2019-06-12 ENCOUNTER — Encounter (HOSPITAL_COMMUNITY): Payer: Self-pay | Admitting: *Deleted

## 2019-06-12 ENCOUNTER — Other Ambulatory Visit: Payer: Self-pay

## 2019-06-12 ENCOUNTER — Emergency Department (HOSPITAL_COMMUNITY)
Admission: EM | Admit: 2019-06-12 | Discharge: 2019-06-12 | Disposition: A | Discharge status: Home / Self Care | Payer: Medicaid Other | Attending: Emergency Medicine | Admitting: Emergency Medicine

## 2019-06-12 DIAGNOSIS — M5431 Sciatica, right side: Secondary | ICD-10-CM

## 2019-06-12 DIAGNOSIS — Z79899 Other long term (current) drug therapy: Secondary | ICD-10-CM | POA: Insufficient documentation

## 2019-06-12 DIAGNOSIS — I10 Essential (primary) hypertension: Secondary | ICD-10-CM

## 2019-06-12 DIAGNOSIS — M5441 Lumbago with sciatica, right side: Secondary | ICD-10-CM | POA: Insufficient documentation

## 2019-06-12 DIAGNOSIS — E876 Hypokalemia: Secondary | ICD-10-CM

## 2019-06-12 LAB — COMPREHENSIVE METABOLIC PANEL
ALT: 25 U/L (ref 0–44)
AST: 24 U/L (ref 15–41)
Albumin: 3.9 g/dL (ref 3.5–5.0)
Alkaline Phosphatase: 77 U/L (ref 38–126)
Anion gap: 11 (ref 5–15)
BUN: 6 mg/dL (ref 6–20)
CO2: 28 mmol/L (ref 22–32)
Calcium: 8.9 mg/dL (ref 8.9–10.3)
Chloride: 99 mmol/L (ref 98–111)
Creatinine, Ser: 0.7 mg/dL (ref 0.44–1.00)
GFR calc Af Amer: >60 mL/min (ref >60)
GFR calc non Af Amer: >60 mL/min (ref >60)
Glucose, Bld: 114 mg/dL — ABNORMAL HIGH (ref 70–99)
Potassium: 3.1 mmol/L — ABNORMAL LOW (ref 3.5–5.1)
Sodium: 138 mmol/L (ref 135–145)
Total Bilirubin: 0.5 mg/dL (ref 0.3–1.2)
Total Protein: 7.8 g/dL (ref 6.5–8.1)

## 2019-06-12 LAB — CBC WITH DIFFERENTIAL/PLATELET
Abs Immature Granulocytes: 0.05 10*3/uL (ref 0.00–0.07)
Basophils Absolute: 0.1 10*3/uL (ref 0.0–0.1)
Basophils Relative: 1 %
Eosinophils Absolute: 0.2 10*3/uL (ref 0.0–0.5)
Eosinophils Relative: 2 %
HCT: 47.2 % — ABNORMAL HIGH (ref 36.0–46.0)
Hemoglobin: 15.6 g/dL — ABNORMAL HIGH (ref 12.0–15.0)
Immature Granulocytes: 0 %
Lymphocytes Relative: 22 %
Lymphs Abs: 3.1 10*3/uL (ref 0.7–4.0)
MCH: 29.9 pg (ref 26.0–34.0)
MCHC: 33.1 g/dL (ref 30.0–36.0)
MCV: 90.6 fL (ref 80.0–100.0)
Monocytes Absolute: 1 10*3/uL (ref 0.1–1.0)
Monocytes Relative: 7 %
Neutro Abs: 9.4 10*3/uL — ABNORMAL HIGH (ref 1.7–7.7)
Neutrophils Relative %: 68 %
Platelets: 290 10*3/uL (ref 150–400)
RBC: 5.21 MIL/uL — ABNORMAL HIGH (ref 3.87–5.11)
RDW: 14.8 % (ref 11.5–15.5)
WBC: 13.8 10*3/uL — ABNORMAL HIGH (ref 4.0–10.5)
nRBC: 0 % (ref 0.0–0.2)

## 2019-06-12 MED ORDER — FENTANYL CITRATE (PF) 100 MCG/2ML IJ SOLN
50.0000 ug | Freq: Once | INTRAMUSCULAR | Status: AC
  Administered 2019-06-12: 50 ug via INTRAMUSCULAR
  Filled 2019-06-12: qty 2

## 2019-06-12 MED ORDER — POTASSIUM CHLORIDE ER 10 MEQ PO TBCR: 10.0000 meq | EXTENDED_RELEASE_TABLET | Freq: Every day | ORAL | 0 refills | Status: AC

## 2019-06-12 MED ORDER — PREDNISONE 10 MG (21) PO TBPK: ORAL_TABLET | ORAL | 0 refills | Status: AC

## 2019-06-12 MED ORDER — HYDROCODONE-ACETAMINOPHEN 5-325 MG PO TABS: 1.0000 | ORAL_TABLET | Freq: Four times a day (QID) | ORAL | 0 refills | Status: AC | PRN

## 2019-06-12 MED ORDER — KETOROLAC TROMETHAMINE 60 MG/2ML IM SOLN
30.0000 mg | Freq: Once | INTRAMUSCULAR | Status: AC
  Administered 2019-06-12: 30 mg via INTRAMUSCULAR
  Filled 2019-06-12: qty 2

## 2019-06-12 MED ORDER — LIDOCAINE 5 % EX PTCH
1.0000 | MEDICATED_PATCH | CUTANEOUS | Status: DC
  Administered 2019-06-12: 1 via TRANSDERMAL
  Filled 2019-06-12: qty 1

## 2019-06-12 MED ORDER — OXYCODONE-ACETAMINOPHEN 5-325 MG PO TABS
2.0000 | ORAL_TABLET | Freq: Once | ORAL | Status: AC
  Administered 2019-06-12: 18:00:00 2 via ORAL
  Filled 2019-06-12: qty 2

## 2019-06-12 MED ORDER — LISINOPRIL 20 MG PO TABS: 20.0000 mg | ORAL_TABLET | Freq: Every day | ORAL | 0 refills | Status: AC

## 2019-06-12 MED ORDER — POTASSIUM CHLORIDE CRYS ER 20 MEQ PO TBCR
40.0000 meq | EXTENDED_RELEASE_TABLET | Freq: Once | ORAL | Status: AC
  Administered 2019-06-12: 40 meq via ORAL
  Filled 2019-06-12: qty 2

## 2019-06-12 MED ORDER — LISINOPRIL 20 MG PO TABS: 20.0000 mg | ORAL_TABLET | Freq: Every day | ORAL | 0 refills | Status: DC

## 2019-06-12 NOTE — ED Provider Notes (Signed)
[facility] EMERGENCY DEPARTMENT Provider Note   CSN: [id number] Arrival date & time: 06/12/19  1734    History   Chief Complaint Chief Complaint  Patient presents with  . Leg Pain  . Hypertension    HPI Karen Cooley is a 38 y.o. female.     HPI  Pt is a 38 y/o female with a h/o chronic back pain s/p lumbar laminectomy/decompression microdiscectomy, HTN, sciatica, migraines who presents to the ED today for eval of right lower back pain that radiates down the RLE. States sxs have been present for the last 2 week but worsened over the last week. Pt denies any numbness/tingling/weakness to the BLE. Denies saddle anesthesia. Denies loss of control of bowels or bladder. No urinary retention. No fevers. Denies a h/o IVDU. Denies a h/o CA or recent unintended weight loss. She was seen in the ED for similar complaints about a week ago and was given narcotics and naproxen. States she ran out of the narcotic pain meds.  She later adds on that she has had BLE swelling for the last month. No unilateral swelling. No SOB. No chest pain. No calf pain or redness.   Pt noted to have HTN on arrival. States she has h/o of this but does not take BP meds for over a year. No new HA, vision changes or neuro complaints. No cadiac/pulm sxs as noted above.   Past Medical History:  Diagnosis Date  . Back pain    herniated nucleus pulposus  . Confusion    related to meds she is on   . Constipation    Colace taken 10days as instructed   . Depression    related to pain but no meds required  . Headache(784.0)   . Hemorrhoids   . History of bronchitis    last time unknown   . Hypertension    takes Benazepril and Chlorthalidone nightly  . Insomnia    recently and related to stress  . Osteoarthritis   . Pregnancy, ectopic   . Sciatica   . Weakness    numbness and tingling to the left leg    Patient Active Problem List   Diagnosis Date Noted  . Lumbar herniated disc 01/02/2014    Past  Surgical History:  Procedure Laterality Date  . CHOLECYSTECTOMY    . ECTOPIC PREGNANCY SURGERY  between 2006 and 2008  . LUMBAR LAMINECTOMY/DECOMPRESSION MICRODISCECTOMY Left 01/02/2014   Procedure: LUMBAR LAMINECTOMY/DECOMPRESSION MICRODISCECTOMY 2 LEVELS;  Surgeon: Karn CassisErnesto M Botero, MD;  Location: [facility];  Service: Neurosurgery;  Laterality: Left;  Left L4-5 L5-S1 Microdiskectomy     OB History   No obstetric history on file.      Home Medications    Prior to Admission medications   Medication Sig Start Date End Date Taking? Authorizing Provider  ibuprofen (ADVIL) 200 MG tablet Take 400 mg by mouth every 6 (six) hours as needed for mild pain or moderate pain.   Yes [provider]  naproxen (NAPROSYN) 500 MG tablet Take 1 tablet (500 mg total) by mouth 2 (two) times daily. 06/08/19  Yes Delo, Riley Lamouglas, MD  polycarbophil (FIBERCON) 625 MG tablet Take 625 mg by mouth daily.   Yes [provider]  HYDROcodone-acetaminophen (NORCO/VICODIN) 5-325 MG tablet Take 1 tablet by mouth every 6 (six) hours as needed. 06/12/19   Lazarus Sudbury S, PA-C  lisinopril (ZESTRIL) 20 MG tablet Take 1 tablet (20 mg total) by mouth daily for 30 days. 06/12/19 07/12/19  Tena Linebaugh,  Bharath Bernstein S, PA-C  potassium chloride (K-DUR) 10 MEQ tablet Take 1 tablet (10 mEq total) by mouth daily for 5 days. 06/12/19 06/17/19  Maricela Schreur S, PA-C  predniSONE (STERAPRED UNI-PAK 21 TAB) 10 MG (21) TBPK tablet Take 6 tabs by mouth daily  for 2 days, then 5 tabs for 2 days, then 4 tabs for 2 days, then 3 tabs for 2 days, 2 tabs for 2 days, then 1 tab by mouth daily for 2 days 06/12/19   Kaushal Vannice S, PA-C    Family History No family history on file.  Social History Social History   Tobacco Use  . Smoking status: Current Every Day Smoker    Packs/day: 1.00    Years: 15.00    Pack years: 15.00    Types: Cigarettes  . Smokeless tobacco: Never Used  Substance Use Topics  . Alcohol use: Yes     Comment: twice a year  . Drug use: No     Allergies   Patient has no known allergies.   Review of Systems Review of Systems  Constitutional: Negative for fever.  HENT: Negative for sore throat.   Eyes: Negative for visual disturbance.  Respiratory: Negative for cough and shortness of breath.   Cardiovascular: Positive for leg swelling. Negative for chest pain.  Gastrointestinal: Negative for abdominal pain, constipation, diarrhea, nausea and vomiting.  Genitourinary: Positive for urgency. Negative for dysuria, flank pain and hematuria.  Musculoskeletal: Positive for back pain.  Skin: Negative for color change and rash.  Neurological: Negative for weakness, numbness and headaches.  All other systems reviewed and are negative.    Physical Exam Updated Vital Signs BP (!) 185/85   Pulse 67   Temp 98.2 F (36.8 C)   Resp 14   Ht 5\' 3"  (1.6 m)   Wt 99.8 kg   LMP 05/30/2019 (Approximate)   SpO2 97%   BMI 38.97 kg/m   Physical Exam Vitals signs and nursing note reviewed.  Constitutional:      General: She is not in acute distress.    Appearance: She is well-developed. She is not ill-appearing or toxic-appearing.  HENT:     Head: Normocephalic and atraumatic.  Eyes:     Conjunctiva/sclera: Conjunctivae normal.  Neck:     Musculoskeletal: Neck supple.  Cardiovascular:     Rate and Rhythm: Normal rate and regular rhythm.     Pulses: Normal pulses.     Heart sounds: Normal heart sounds. No murmur.  Pulmonary:     Effort: Pulmonary effort is normal. No respiratory distress.     Breath sounds: Normal breath sounds. No wheezing, rhonchi or rales.  Abdominal:     General: Bowel sounds are normal.     Palpations: Abdomen is soft.     Tenderness: There is no abdominal tenderness. There is no guarding or rebound.  Musculoskeletal:     Comments: Well healed surgical scar over the lumbar spine. TTP to the midline lumbar spine and to the right SI joint. No overlying skin  changes. Trace BLE edema. No calf tenderness or erythema. Normal distal pulses.  Skin:    General: Skin is warm and dry.  Neurological:     Mental Status: She is alert.     Comments: Motor:  Normal tone. 5/5 strength of BUE and BLE major muscle groups including strong and equal grip strength and dorsiflexion/plantar flexion Gait: normal gait and balance. No limp noted.  Normal coordination. No sensory changes.  ED Treatments / Results  Labs (all labs ordered are listed, but only abnormal results are displayed) Labs Reviewed  CBC WITH DIFFERENTIAL/PLATELET - Abnormal; Notable for the following components:      Result Value   WBC 13.8 (*)    RBC 5.21 (*)    Hemoglobin 15.6 (*)    HCT 47.2 (*)    Neutro Abs 9.4 (*)    All other components within normal limits  COMPREHENSIVE METABOLIC PANEL - Abnormal; Notable for the following components:   Potassium 3.1 (*)    Glucose, Bld 114 (*)    All other components within normal limits    EKG EKG Interpretation  Date/Time:  Tuesday June 12 2019 20:34:16 EDT Ventricular Rate:  68 PR Interval:    QRS Duration: 99 QT Interval:  534 QTC Calculation: 568 R Axis:   -155 Text Interpretation:  Sinus rhythm S1,S2,S3 pattern Abnormal R-wave progression, late transition Nonspecific T abnrm, anterolateral leads Prolonged QT interval Confirmed by Bethann BerkshireZammit, Joseph [phone]([id number]) on 06/12/2019 9:24:19 PM   Radiology No results found.  Procedures Procedures (including critical care time)  Medications Ordered in ED Medications  lidocaine (LIDODERM) 5 % 1 patch (1 patch Transdermal Patch Applied 06/12/19 1820)  oxyCODONE-acetaminophen (PERCOCET/ROXICET) 5-325 MG per tablet 2 tablet (2 tablets Oral Given 06/12/19 1820)  ketorolac (TORADOL) injection 30 mg (30 mg Intramuscular Given 06/12/19 1820)  fentaNYL (SUBLIMAZE) injection 50 mcg (50 mcg Intramuscular Given 06/12/19 1937)  potassium chloride SA (K-DUR) CR tablet 40 mEq (40 mEq Oral Given  06/12/19 2146)     Initial Impression / Assessment and Plan / ED Course  I have reviewed the triage vital signs and the nursing notes.  Pertinent labs & imaging results that were available during my care of the patient were reviewed by me and considered in my medical decision making (see chart for details).   Final Clinical Impressions(s) / ED Diagnoses   Final diagnoses:  Sciatica of right side  Hypertension, unspecified type  Hypokalemia   Pt is a 38 y/o female with a h/o chronic back pain s/p lumbar laminectomy/decompression microdiscectomy, HTN, sciatica, migraines who presents to the ED today for eval of right lower back pain that radiates down the RLE. States sxs have been present for the last 2 week but worsened over the last week. No red flag signs or sxs. Also mentions leg swelling x1 month.  Back pain: Seems related to sciatica. No red flag signs or sxs. Ambulatory in the ED. Given toradol, percocet and lidoderm patch and on reassessment she states she still has some pain but that she feels much improved. Will give rx for prednisone and a short course of pain medication. [place] narcotic database reviewed and there are no red flags. Will give pcp and neurosurg f/u.   Bilat leg swelling: Advised conservative measures such as decreasing sodium intake, compression stockings and elevation. Advised on importance of f/u with regard to this.   HTN: Has h/o this and is noncompliant with meds. No signs or sxs to suggest HTN emergency at this time. After back pain was tx'ed, repeat BP improved with pain medication but still remains high. Labs added to r/o AKI, etc.   -- CBC with mild leukocytosis, has h/o this on prior labs. No anemia.   -- CMP with hypokalemia (supplemented in the ED and with RX for home), normal renal and hepatic fxn  -- EKG Sinus rhythm S1,S2,S3 pattern, Abn R-wave progression, late transition Nonspecific T abnrm, anterolateral leads, Prolonged QT interval  --  Will give rx  for lisinopril and advise to f/u with pcp  Advised pt on above plan. Advised on importance of f/u. She will be given referral to PCP. Have advised her to return to the ED for new or worsening sxs. She voices understanding of the plan and reasons to return. All questions answered. Pt stable for d/c.  Pt seen in conjunction with Dr. Roderic Palau who personally evaluated the pt and is in agreement with plan.   ED Discharge Orders         Ordered    lisinopril (ZESTRIL) 20 MG tablet  Daily,   Status:  Discontinued     06/12/19 2125    lisinopril (ZESTRIL) 20 MG tablet  Daily     06/12/19 2126    predniSONE (STERAPRED UNI-PAK 21 TAB) 10 MG (21) TBPK tablet     06/12/19 2126    HYDROcodone-acetaminophen (NORCO/VICODIN) 5-325 MG tablet  Every 6 hours PRN     06/12/19 2126    potassium chloride (K-DUR) 10 MEQ tablet  Daily     06/12/19 2131           Rodney Booze, PA-C 06/12/19 2211    Milton Ferguson, MD 06/14/19 1622

## 2019-06-12 NOTE — Discharge Instructions (Addendum)
Prescription given for Norco. Take medication as directed and do not operate machinery, drive a car, or work while taking this medication as it can make you drowsy.   Please take the blood pressure and steroid medications as directed.   Please follow up with your primary care provider within 5-7 days for re-evaluation of your symptoms and to ensure that your blood pressure is improving. If you do not have a primary care provider, information for a healthcare clinic has been provided for you to make arrangements for follow up care. Please return to the emergency department for any new or worsening symptoms.

## 2019-06-12 NOTE — ED Triage Notes (Addendum)
Pt c/o right lower back that radiates down the right leg along with swelling of her feet that started 1 month ago, worsening last Thursday. Pt reports difficulty with moving her bowels. Pt reports she was seen here last Thursday for same.  Pt reports she has hypertension but hasn't taken her medication in over a year. P'ts BP 241/122 in triage. Denies dizziness, blurry vision, no new headache (pt reports hx of chronic headaches).

## 2021-11-13 ENCOUNTER — Other Ambulatory Visit (HOSPITAL_COMMUNITY): Payer: Self-pay | Admitting: Nurse Practitioner

## 2021-11-13 DIAGNOSIS — Z1231 Encounter for screening mammogram for malignant neoplasm of breast: Secondary | ICD-10-CM

## 2021-11-23 ENCOUNTER — Ambulatory Visit (HOSPITAL_COMMUNITY)
Admission: RE | Admit: 2021-11-23 | Discharge: 2021-11-23 | Disposition: A | Discharge status: Home / Self Care | Payer: Medicaid Other | Source: Ambulatory Visit | Attending: Nurse Practitioner | Admitting: Nurse Practitioner

## 2021-11-23 ENCOUNTER — Other Ambulatory Visit: Payer: Self-pay

## 2021-11-23 DIAGNOSIS — Z1231 Encounter for screening mammogram for malignant neoplasm of breast: Secondary | ICD-10-CM | POA: Insufficient documentation

## 2023-08-04 ENCOUNTER — Telehealth: Payer: Self-pay

## 2023-08-04 NOTE — Telephone Encounter (Signed)
Attempted Follow up wellness call of [facility] client dual enrolled from [facility] No answer, left voicemail.  PCP [facility] Last appointment 05/05/23  Next appointment 08/09/23  Will mail [facility] Connect welcome letter that list services available.   Francee Nodal RN [employer]

## 2024-06-11 ENCOUNTER — Telehealth: Payer: Self-pay

## 2024-06-11 NOTE — Telephone Encounter (Signed)
 Attempted call to [facility]/ [facility] client for follow up today. NO answer, left voicemail requesting return call.  Next appointment scheduled with health department is 08/22/24.  Will continue to follow for needs and resources.   Kris Pester RN [employer]

## 2024-09-12 NOTE — Congregational Nurse Program (Signed)
 Completed follow up and review of medical records, labwork, diagnosis and provider notes as it relates to pt's dual enrollment with [employer] and the care connect uninsured program .  Also completed chart review as related to pt's bp values at her last PCP visit as of 08/29/24.   Confirmed per review that pt's bp values recorded are within the range of  HTN CONTROLLED with use of medication (losartan 50 mg) due to BP values being below 140/90 per Care Connect program criteria  Next Scheduled PCP appt is 02/27/25 at 3:00pm with her PCP at [facility]  Plan     Updated care coordination note related to pts care connect enrollment status in [facility]
